# Patient Record
Sex: Female | Born: 1975 | Race: Black or African American | Hispanic: No | Marital: Single | Smoking: Never smoker
Health system: Southern US, Community
[De-identification: ages and names within clinical notes are randomized; demographics above are authoritative.]

## PROBLEM LIST (undated history)

## (undated) DIAGNOSIS — E079 Disorder of thyroid, unspecified: Secondary | ICD-10-CM

## (undated) DIAGNOSIS — D649 Anemia, unspecified: Secondary | ICD-10-CM

## (undated) DIAGNOSIS — R87629 Unspecified abnormal cytological findings in specimens from vagina: Secondary | ICD-10-CM

## (undated) DIAGNOSIS — D219 Benign neoplasm of connective and other soft tissue, unspecified: Secondary | ICD-10-CM

## (undated) DIAGNOSIS — Z9889 Other specified postprocedural states: Secondary | ICD-10-CM

## (undated) HISTORY — DX: Unspecified abnormal cytological findings in specimens from vagina: R87.629

## (undated) HISTORY — DX: Other specified postprocedural states: Z98.890

## (undated) HISTORY — DX: Anemia, unspecified: D64.9

## (undated) HISTORY — DX: Benign neoplasm of connective and other soft tissue, unspecified: D21.9

---

## 1998-06-27 ENCOUNTER — Other Ambulatory Visit: Admission: RE | Admit: 1998-06-27 | Discharge: 1998-06-27 | Payer: Self-pay | Admitting: Obstetrics

## 1998-06-29 ENCOUNTER — Other Ambulatory Visit: Admission: RE | Admit: 1998-06-29 | Discharge: 1998-06-29 | Payer: Self-pay | Admitting: Obstetrics

## 2003-10-16 ENCOUNTER — Other Ambulatory Visit: Admission: RE | Admit: 2003-10-16 | Discharge: 2003-10-16 | Payer: Self-pay | Admitting: Obstetrics and Gynecology

## 2003-11-05 ENCOUNTER — Other Ambulatory Visit: Admission: RE | Admit: 2003-11-05 | Discharge: 2003-11-05 | Payer: Self-pay | Admitting: Obstetrics and Gynecology

## 2003-11-07 ENCOUNTER — Ambulatory Visit (HOSPITAL_COMMUNITY): Admission: RE | Admit: 2003-11-07 | Discharge: 2003-11-07 | Payer: Self-pay | Admitting: General Surgery

## 2003-12-05 ENCOUNTER — Ambulatory Visit (HOSPITAL_COMMUNITY): Admission: RE | Admit: 2003-12-05 | Discharge: 2003-12-05 | Payer: Self-pay | Admitting: General Surgery

## 2003-12-12 ENCOUNTER — Ambulatory Visit (HOSPITAL_COMMUNITY): Admission: RE | Admit: 2003-12-12 | Discharge: 2003-12-12 | Payer: Self-pay | Admitting: Obstetrics and Gynecology

## 2003-12-19 ENCOUNTER — Inpatient Hospital Stay (HOSPITAL_COMMUNITY): Admission: AD | Admit: 2003-12-19 | Discharge: 2003-12-19 | Payer: Self-pay | Admitting: Obstetrics and Gynecology

## 2003-12-21 HISTORY — PX: NECK SURGERY: SHX720

## 2004-01-10 ENCOUNTER — Ambulatory Visit (HOSPITAL_COMMUNITY): Admission: RE | Admit: 2004-01-10 | Discharge: 2004-01-10 | Payer: Self-pay | Admitting: General Surgery

## 2004-01-21 DIAGNOSIS — Z9889 Other specified postprocedural states: Secondary | ICD-10-CM

## 2004-01-21 HISTORY — DX: Other specified postprocedural states: Z98.890

## 2004-02-14 ENCOUNTER — Inpatient Hospital Stay (HOSPITAL_COMMUNITY): Admission: RE | Admit: 2004-02-14 | Discharge: 2004-02-15 | Payer: Self-pay | Admitting: General Surgery

## 2004-12-20 HISTORY — PX: LEEP: SHX91

## 2005-03-11 ENCOUNTER — Other Ambulatory Visit: Admission: RE | Admit: 2005-03-11 | Discharge: 2005-03-11 | Payer: Self-pay | Admitting: Obstetrics and Gynecology

## 2005-10-27 IMAGING — US US SOFT TISSUE HEAD/NECK
1 series · 14 of 25 positions shown · non-contrast
Comparison: none

CLINICAL DATA: The patient has a thyroid nodule.

[Series 1: unknown · 0.08mm/px · 14 of 55 slices shown]
[im 1/55]
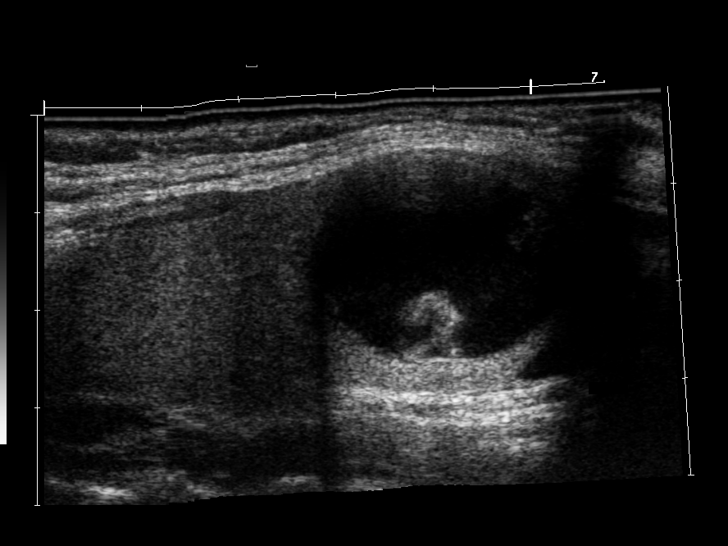
[im 5/55]
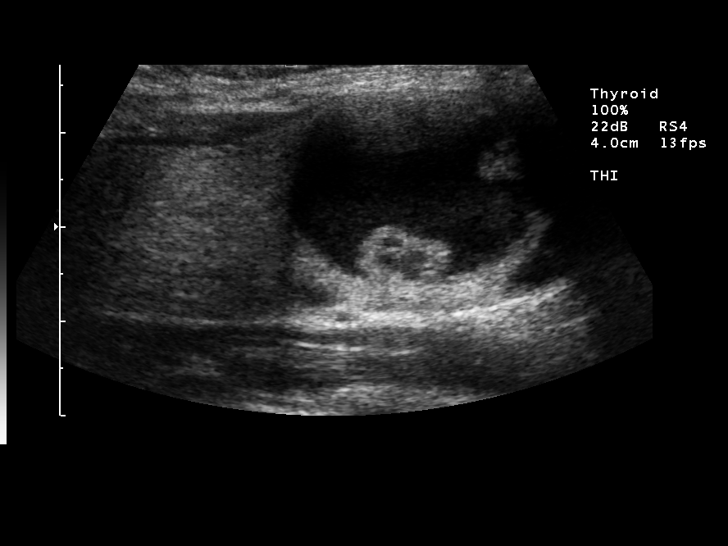
[im 10/55]
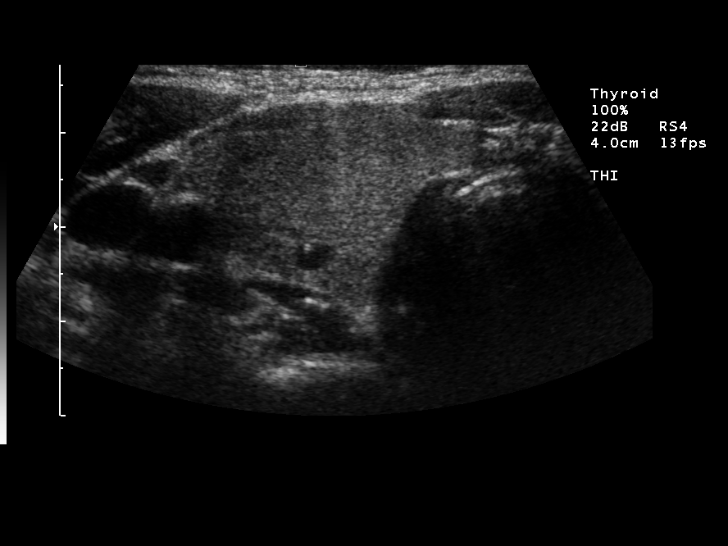
[im 14/55]
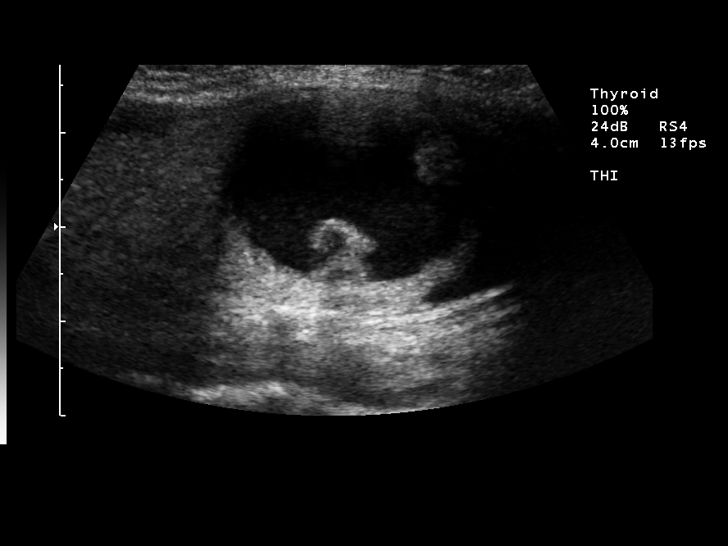
[im 19/55]
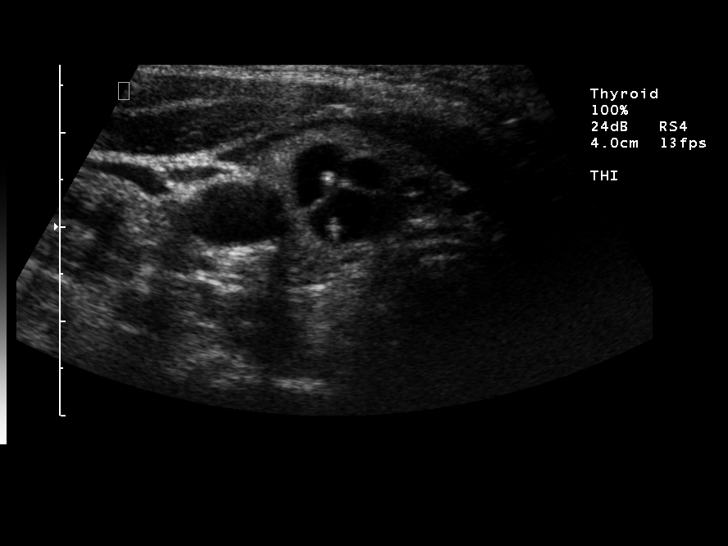
[im 21/55]
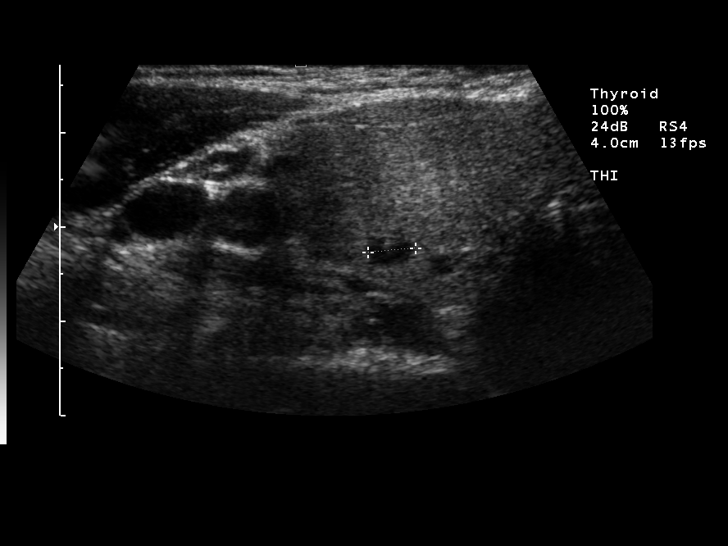
[im 25/55]
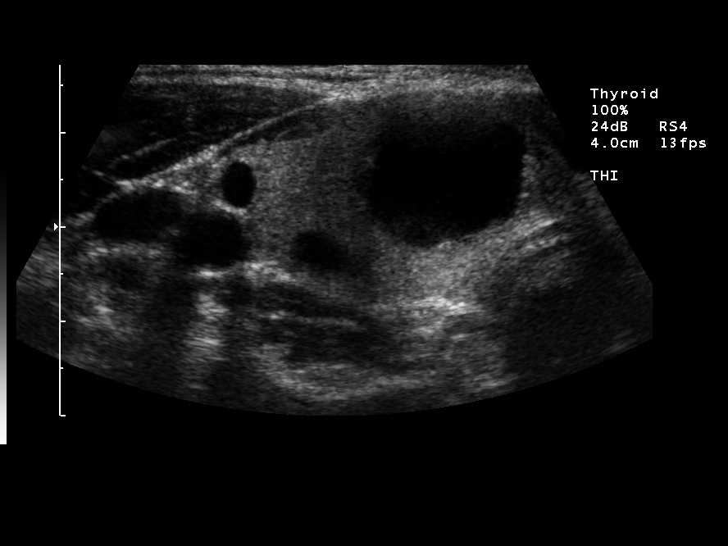
[im 30/55]
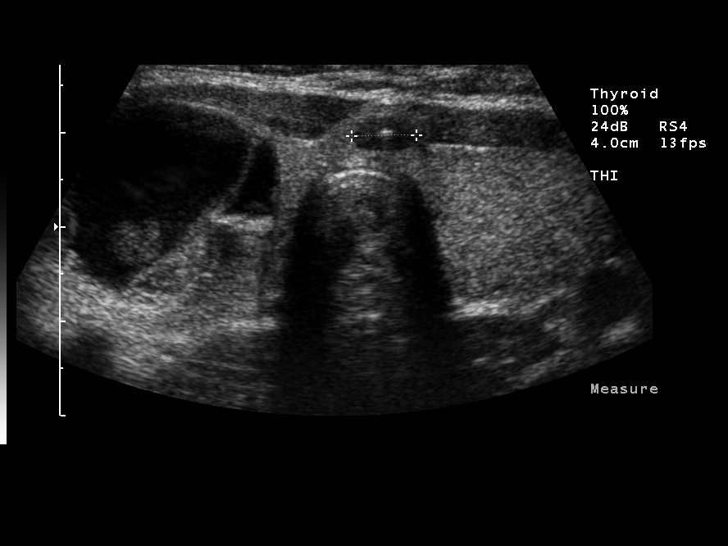
[im 34/55]
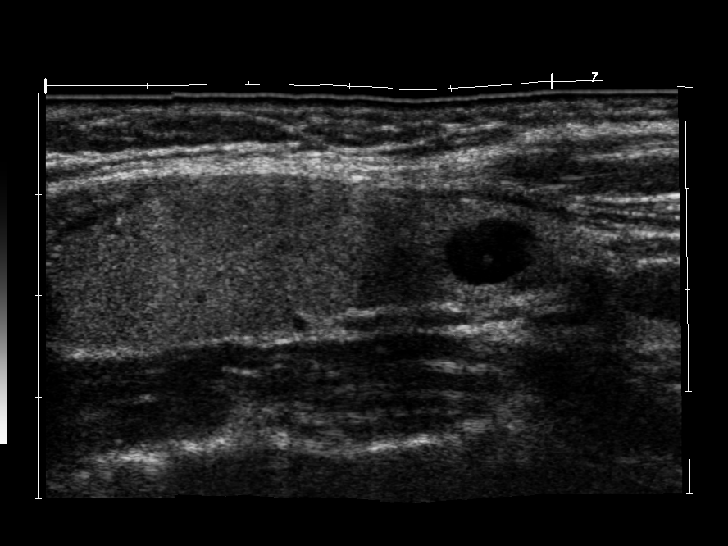
[im 37/55]
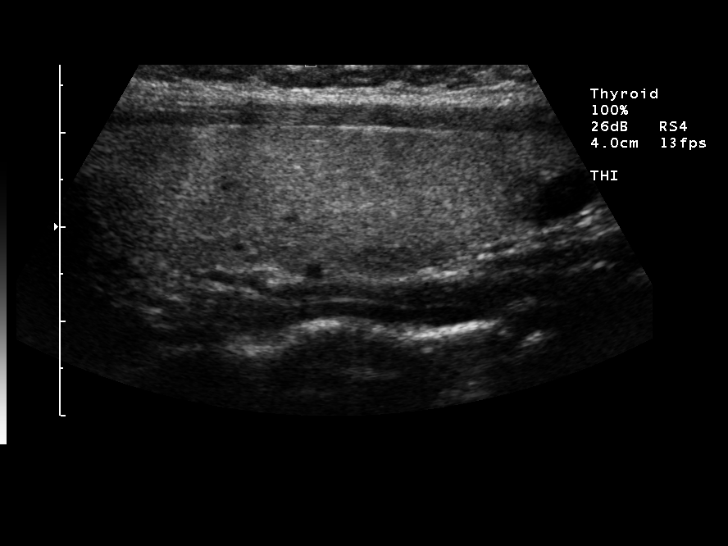
[im 41/55]
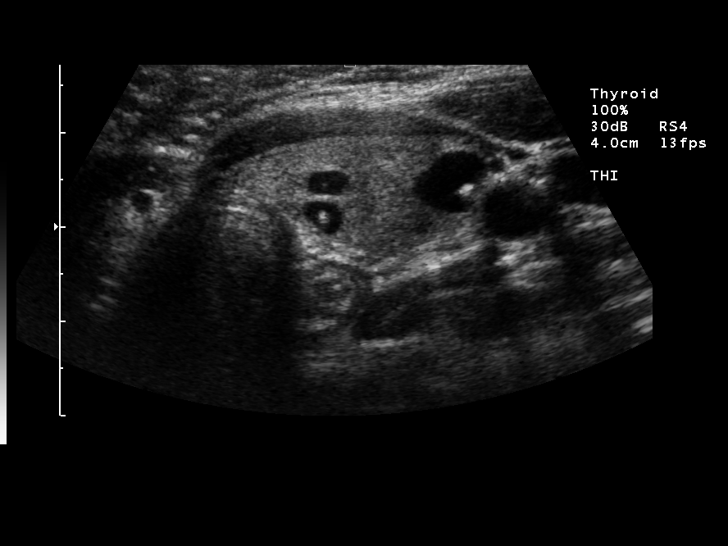
[im 46/55]
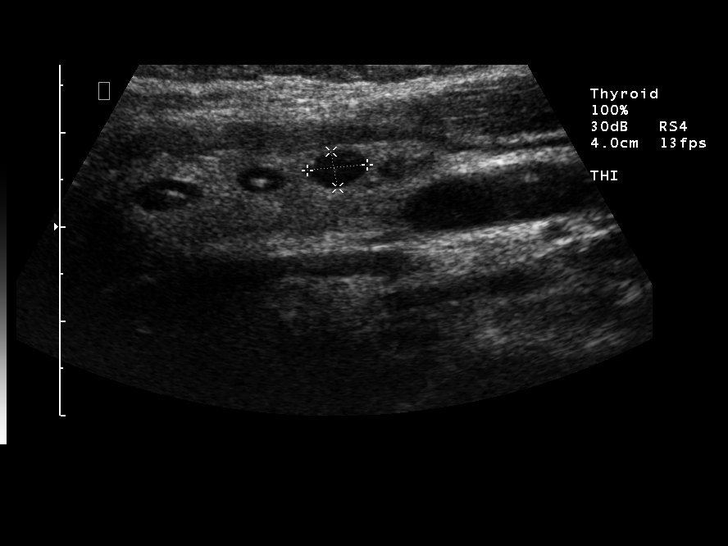
[im 50/55]
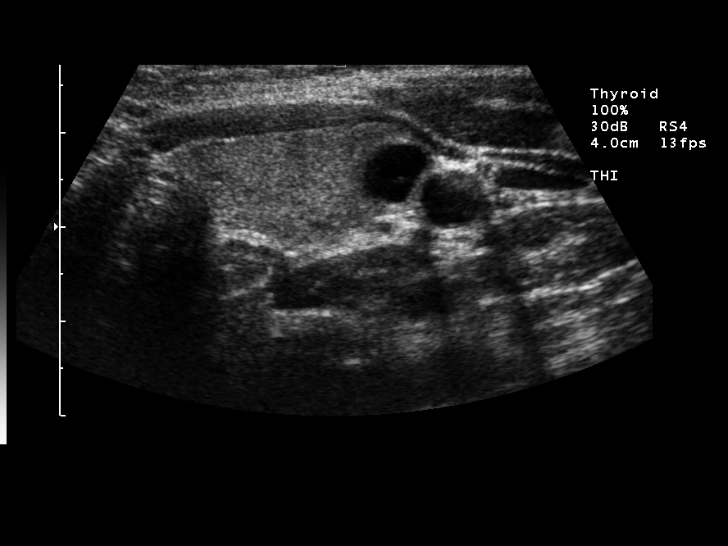
[im 55/55]
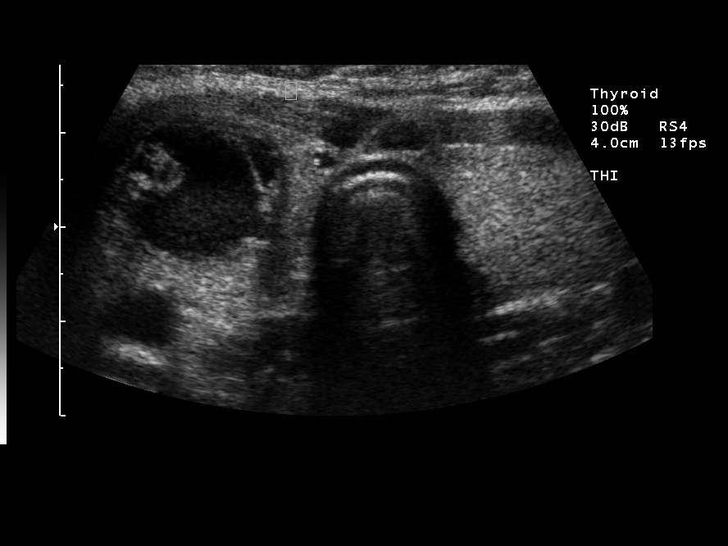

[14 of 25 positions shown; findings below may reference images not displayed]

ULTRASOUND OF THE THYROID
 The right lobe measures 7.3 x 2.3 x 2.5 cm.  The left lobe measures 5.6 x 1.6 x 2.7 cm.  There are noted to be multiple degenerated nodules bilaterally.  The largest is in the lower pole on the right measuring 2.7 x 1.9 x 2.5 cm.  There is a solid component in this degenerated mass of the right lobe.  There are also internal echoes.

 IMPRESSION
 Multinodular goiter with a 2.7 cm degenerated nodule in the right lobe that has internal echoes with an apparent associated solid wall mass.  Biopsy of this mass would be suggested for further evaluation.

## 2006-08-17 ENCOUNTER — Other Ambulatory Visit: Admission: RE | Admit: 2006-08-17 | Discharge: 2006-08-17 | Payer: Self-pay | Admitting: Obstetrics and Gynecology

## 2011-01-09 ENCOUNTER — Encounter (HOSPITAL_BASED_OUTPATIENT_CLINIC_OR_DEPARTMENT_OTHER): Payer: Self-pay | Admitting: General Surgery

## 2011-06-16 ENCOUNTER — Telehealth: Payer: Self-pay | Admitting: Cardiology

## 2011-07-22 NOTE — Telephone Encounter (Signed)
Close  

## 2015-12-31 ENCOUNTER — Emergency Department (HOSPITAL_COMMUNITY): Payer: 59

## 2015-12-31 ENCOUNTER — Encounter (HOSPITAL_COMMUNITY): Payer: Self-pay | Admitting: Emergency Medicine

## 2015-12-31 ENCOUNTER — Inpatient Hospital Stay (HOSPITAL_COMMUNITY)
Admission: EM | Admit: 2015-12-31 | Discharge: 2016-01-05 | DRG: 872 | Disposition: A | Discharge status: Home / Self Care | Payer: 59 | Attending: Internal Medicine | Admitting: Internal Medicine

## 2015-12-31 DIAGNOSIS — B9689 Other specified bacterial agents as the cause of diseases classified elsewhere: Secondary | ICD-10-CM | POA: Diagnosis present

## 2015-12-31 DIAGNOSIS — D649 Anemia, unspecified: Secondary | ICD-10-CM | POA: Diagnosis not present

## 2015-12-31 DIAGNOSIS — D696 Thrombocytopenia, unspecified: Secondary | ICD-10-CM | POA: Diagnosis present

## 2015-12-31 DIAGNOSIS — R1012 Left upper quadrant pain: Secondary | ICD-10-CM | POA: Diagnosis not present

## 2015-12-31 DIAGNOSIS — Z886 Allergy status to analgesic agent status: Secondary | ICD-10-CM | POA: Diagnosis not present

## 2015-12-31 DIAGNOSIS — E039 Hypothyroidism, unspecified: Secondary | ICD-10-CM | POA: Diagnosis present

## 2015-12-31 DIAGNOSIS — R509 Fever, unspecified: Secondary | ICD-10-CM | POA: Diagnosis present

## 2015-12-31 DIAGNOSIS — F40240 Claustrophobia: Secondary | ICD-10-CM | POA: Diagnosis present

## 2015-12-31 DIAGNOSIS — R195 Other fecal abnormalities: Secondary | ICD-10-CM | POA: Diagnosis present

## 2015-12-31 DIAGNOSIS — Z791 Long term (current) use of non-steroidal anti-inflammatories (NSAID): Secondary | ICD-10-CM

## 2015-12-31 DIAGNOSIS — R7881 Bacteremia: Secondary | ICD-10-CM | POA: Diagnosis not present

## 2015-12-31 DIAGNOSIS — R109 Unspecified abdominal pain: Secondary | ICD-10-CM | POA: Diagnosis present

## 2015-12-31 DIAGNOSIS — B9620 Unspecified Escherichia coli [E. coli] as the cause of diseases classified elsewhere: Secondary | ICD-10-CM | POA: Diagnosis present

## 2015-12-31 DIAGNOSIS — N390 Urinary tract infection, site not specified: Secondary | ICD-10-CM | POA: Diagnosis present

## 2015-12-31 DIAGNOSIS — D62 Acute posthemorrhagic anemia: Secondary | ICD-10-CM | POA: Diagnosis present

## 2015-12-31 DIAGNOSIS — Z803 Family history of malignant neoplasm of breast: Secondary | ICD-10-CM

## 2015-12-31 DIAGNOSIS — N920 Excessive and frequent menstruation with regular cycle: Secondary | ICD-10-CM

## 2015-12-31 DIAGNOSIS — E876 Hypokalemia: Secondary | ICD-10-CM | POA: Diagnosis present

## 2015-12-31 DIAGNOSIS — A400 Sepsis due to streptococcus, group A: Secondary | ICD-10-CM | POA: Diagnosis not present

## 2015-12-31 DIAGNOSIS — D259 Leiomyoma of uterus, unspecified: Secondary | ICD-10-CM | POA: Diagnosis present

## 2015-12-31 DIAGNOSIS — R350 Frequency of micturition: Secondary | ICD-10-CM | POA: Diagnosis present

## 2015-12-31 DIAGNOSIS — Z8249 Family history of ischemic heart disease and other diseases of the circulatory system: Secondary | ICD-10-CM | POA: Diagnosis not present

## 2015-12-31 DIAGNOSIS — A419 Sepsis, unspecified organism: Secondary | ICD-10-CM

## 2015-12-31 DIAGNOSIS — I959 Hypotension, unspecified: Secondary | ICD-10-CM | POA: Diagnosis present

## 2015-12-31 DIAGNOSIS — N83209 Unspecified ovarian cyst, unspecified side: Secondary | ICD-10-CM

## 2015-12-31 DIAGNOSIS — K219 Gastro-esophageal reflux disease without esophagitis: Secondary | ICD-10-CM | POA: Diagnosis present

## 2015-12-31 DIAGNOSIS — D219 Benign neoplasm of connective and other soft tissue, unspecified: Secondary | ICD-10-CM | POA: Diagnosis present

## 2015-12-31 DIAGNOSIS — J40 Bronchitis, not specified as acute or chronic: Secondary | ICD-10-CM | POA: Diagnosis present

## 2015-12-31 DIAGNOSIS — R52 Pain, unspecified: Secondary | ICD-10-CM

## 2015-12-31 DIAGNOSIS — R112 Nausea with vomiting, unspecified: Secondary | ICD-10-CM | POA: Diagnosis not present

## 2015-12-31 DIAGNOSIS — N839 Noninflammatory disorder of ovary, fallopian tube and broad ligament, unspecified: Secondary | ICD-10-CM | POA: Diagnosis not present

## 2015-12-31 DIAGNOSIS — A4151 Sepsis due to Escherichia coli [E. coli]: Secondary | ICD-10-CM | POA: Diagnosis not present

## 2015-12-31 DIAGNOSIS — A408 Other streptococcal sepsis: Secondary | ICD-10-CM | POA: Diagnosis not present

## 2015-12-31 HISTORY — DX: Disorder of thyroid, unspecified: E07.9

## 2015-12-31 LAB — CBC WITH DIFFERENTIAL/PLATELET
Basophils Absolute: 0 10*3/uL (ref 0.0–0.1)
Basophils Absolute: 0 10*3/uL (ref 0.0–0.1)
Basophils Relative: 0 %
Basophils Relative: 0 %
Eosinophils Absolute: 0 10*3/uL (ref 0.0–0.7)
Eosinophils Absolute: 0 10*3/uL (ref 0.0–0.7)
Eosinophils Relative: 0 %
Eosinophils Relative: 0 %
HCT: 13.9 % — ABNORMAL LOW (ref 36.0–46.0)
HCT: 13.8 % — ABNORMAL LOW (ref 36.0–46.0)
Hemoglobin: 3.6 g/dL — CL (ref 12.0–15.0)
Hemoglobin: 3.7 g/dL — CL (ref 12.0–15.0)
Lymphocytes Relative: 2 %
Lymphocytes Relative: 2 %
Lymphs Abs: 0.3 10*3/uL — ABNORMAL LOW (ref 0.7–4.0)
Lymphs Abs: 0.3 10*3/uL — ABNORMAL LOW (ref 0.7–4.0)
MCH: 16.1 pg — ABNORMAL LOW (ref 26.0–34.0)
MCH: 16.5 pg — ABNORMAL LOW (ref 26.0–34.0)
MCHC: 25.9 g/dL — ABNORMAL LOW (ref 30.0–36.0)
MCHC: 26.8 g/dL — ABNORMAL LOW (ref 30.0–36.0)
MCV: 62.3 fL — ABNORMAL LOW (ref 78.0–100.0)
MCV: 61.6 fL — ABNORMAL LOW (ref 78.0–100.0)
Monocytes Absolute: 0.3 10*3/uL (ref 0.1–1.0)
Monocytes Absolute: 0.7 10*3/uL (ref 0.1–1.0)
Monocytes Relative: 2 %
Monocytes Relative: 5 %
Neutro Abs: 12.9 10*3/uL — ABNORMAL HIGH (ref 1.7–7.7)
Neutro Abs: 12.5 10*3/uL — ABNORMAL HIGH (ref 1.7–7.7)
Neutrophils Relative %: 96 %
Neutrophils Relative %: 93 %
Platelets: 118 10*3/uL — ABNORMAL LOW (ref 150–400)
Platelets: 117 10*3/uL — ABNORMAL LOW (ref 150–400)
RBC: 2.23 MIL/uL — ABNORMAL LOW (ref 3.87–5.11)
RBC: 2.24 MIL/uL — ABNORMAL LOW (ref 3.87–5.11)
RDW: 20.1 % — ABNORMAL HIGH (ref 11.5–15.5)
RDW: 20.1 % — ABNORMAL HIGH (ref 11.5–15.5)
WBC: 13.5 10*3/uL — ABNORMAL HIGH (ref 4.0–10.5)
WBC: 13.5 10*3/uL — ABNORMAL HIGH (ref 4.0–10.5)

## 2015-12-31 LAB — COMPREHENSIVE METABOLIC PANEL
ALT: 13 U/L — ABNORMAL LOW (ref 14–54)
AST: 40 U/L (ref 15–41)
Albumin: 3.6 g/dL (ref 3.5–5.0)
Alkaline Phosphatase: 43 U/L (ref 38–126)
Anion gap: 15 (ref 5–15)
BUN: 13 mg/dL (ref 6–20)
CO2: 18 mmol/L — ABNORMAL LOW (ref 22–32)
Calcium: 9.4 mg/dL (ref 8.9–10.3)
Chloride: 102 mmol/L (ref 101–111)
Creatinine, Ser: 1.11 mg/dL — ABNORMAL HIGH (ref 0.44–1.00)
GFR calc Af Amer: >60 mL/min (ref >60)
GFR calc non Af Amer: >60 mL/min (ref >60)
Glucose, Bld: 143 mg/dL — ABNORMAL HIGH (ref 65–99)
Potassium: 2.8 mmol/L — ABNORMAL LOW (ref 3.5–5.1)
Sodium: 135 mmol/L (ref 135–145)
Total Bilirubin: 0.6 mg/dL (ref 0.3–1.2)
Total Protein: 7.8 g/dL (ref 6.5–8.1)

## 2015-12-31 LAB — RETICULOCYTES
RBC.: 2.24 MIL/uL — ABNORMAL LOW (ref 3.87–5.11)
Retic Count, Absolute: 33.6 10*3/uL (ref 19.0–186.0)
Retic Ct Pct: 1.5 % (ref 0.4–3.1)

## 2015-12-31 LAB — TSH: TSH: 2.863 u[IU]/mL (ref 0.350–4.500)

## 2015-12-31 LAB — URINALYSIS, ROUTINE W REFLEX MICROSCOPIC
Bilirubin Urine: NEGATIVE
Glucose, UA: NEGATIVE mg/dL
Ketones, ur: NEGATIVE mg/dL
Nitrite: POSITIVE — AB
Protein, ur: 100 mg/dL — AB
Specific Gravity, Urine: 1.019 (ref 1.005–1.030)
pH: 6 (ref 5.0–8.0)

## 2015-12-31 LAB — CBG MONITORING, ED: Glucose-Capillary: 134 mg/dL — ABNORMAL HIGH (ref 65–99)

## 2015-12-31 LAB — LACTIC ACID, PLASMA
Lactic Acid, Venous: 4.3 mmol/L (ref 0.5–2.0)
Lactic Acid, Venous: 1.6 mmol/L (ref 0.5–2.0)

## 2015-12-31 LAB — URINE MICROSCOPIC-ADD ON

## 2015-12-31 LAB — DIC (DISSEMINATED INTRAVASCULAR COAGULATION)PANEL
D-Dimer, Quant: 1.58 ug/mL-FEU — ABNORMAL HIGH (ref 0.00–0.50)
INR: 1.52 — ABNORMAL HIGH (ref 0.00–1.49)
Platelets: 103 10*3/uL — ABNORMAL LOW (ref 150–400)
Smear Review: NONE SEEN
aPTT: 37 seconds (ref 24–37)

## 2015-12-31 LAB — DIC (DISSEMINATED INTRAVASCULAR COAGULATION) PANEL
FIBRINOGEN: 538 mg/dL — AB (ref 204–475)
PROTHROMBIN TIME: 18.4 s — AB (ref 11.6–15.2)

## 2015-12-31 LAB — POC OCCULT BLOOD, ED: FECAL OCCULT BLD: POSITIVE — AB

## 2015-12-31 LAB — PREPARE RBC (CROSSMATCH)

## 2015-12-31 LAB — PREGNANCY, URINE: PREG TEST UR: NEGATIVE

## 2015-12-31 LAB — LACTATE DEHYDROGENASE: LDH: 145 U/L (ref 98–192)

## 2015-12-31 LAB — LIPASE, BLOOD: Lipase: 26 U/L (ref 11–51)

## 2015-12-31 LAB — ABO/RH: ABO/RH(D): A POS

## 2015-12-31 MED ORDER — POTASSIUM CHLORIDE 10 MEQ/100ML IV SOLN: 10.0000 meq | INTRAVENOUS | Status: AC

## 2015-12-31 MED ORDER — ACETAMINOPHEN 325 MG PO TABS: 650.0000 mg | ORAL_TABLET | Freq: Four times a day (QID) | ORAL | Status: DC | PRN

## 2015-12-31 MED ORDER — MORPHINE SULFATE (PF) 2 MG/ML IV SOLN
2.0000 mg | INTRAVENOUS | Status: DC | PRN
  Administered 2015-12-31 – 2016-01-02 (×4): 2 mg via INTRAVENOUS
  Filled 2015-12-31 (×4): qty 1

## 2015-12-31 MED ORDER — VANCOMYCIN HCL IN DEXTROSE 1-5 GM/200ML-% IV SOLN
1000.0000 mg | Freq: Two times a day (BID) | INTRAVENOUS | Status: DC
  Administered 2016-01-01 – 2016-01-03 (×5): 1000 mg via INTRAVENOUS
  Filled 2015-12-31 (×5): qty 200

## 2015-12-31 MED ORDER — SODIUM CHLORIDE 0.9 % IV SOLN
Freq: Once | INTRAVENOUS | Status: AC
  Administered 2015-12-31: 18:00:00 via INTRAVENOUS

## 2015-12-31 MED ORDER — ADULT MULTIVITAMIN W/MINERALS CH
1.0000 | ORAL_TABLET | Freq: Every day | ORAL | Status: DC
  Administered 2015-12-31 – 2016-01-05 (×6): 1 via ORAL
  Filled 2015-12-31 (×7): qty 1

## 2015-12-31 MED ORDER — IOHEXOL 300 MG/ML  SOLN
100.0000 mL | Freq: Once | INTRAMUSCULAR | Status: AC | PRN
  Administered 2015-12-31: 100 mL via INTRAVENOUS

## 2015-12-31 MED ORDER — PIPERACILLIN-TAZOBACTAM 3.375 G IVPB 30 MIN
3.3750 g | Freq: Once | INTRAVENOUS | Status: AC
  Administered 2015-12-31: 3.375 g via INTRAVENOUS
  Filled 2015-12-31: qty 50

## 2015-12-31 MED ORDER — POTASSIUM CHLORIDE 10 MEQ/100ML IV SOLN
10.0000 meq | Freq: Once | INTRAVENOUS | Status: AC
  Administered 2015-12-31: 10 meq via INTRAVENOUS
  Filled 2015-12-31: qty 100

## 2015-12-31 MED ORDER — ACETAMINOPHEN 325 MG PO TABS
650.0000 mg | ORAL_TABLET | Freq: Once | ORAL | Status: AC
  Administered 2015-12-31: 650 mg via ORAL
  Filled 2015-12-31: qty 2

## 2015-12-31 MED ORDER — POTASSIUM CHLORIDE CRYS ER 20 MEQ PO TBCR
40.0000 meq | EXTENDED_RELEASE_TABLET | Freq: Once | ORAL | Status: AC
  Administered 2015-12-31: 40 meq via ORAL
  Filled 2015-12-31: qty 2

## 2015-12-31 MED ORDER — SODIUM CHLORIDE 0.9 % IV BOLUS (SEPSIS)
1000.0000 mL | Freq: Once | INTRAVENOUS | Status: AC
  Administered 2015-12-31: 1000 mL via INTRAVENOUS

## 2015-12-31 MED ORDER — VANCOMYCIN HCL 10 G IV SOLR
1500.0000 mg | Freq: Once | INTRAVENOUS | Status: AC
  Administered 2015-12-31: 1500 mg via INTRAVENOUS
  Filled 2015-12-31: qty 1500

## 2015-12-31 MED ORDER — VANCOMYCIN HCL IN DEXTROSE 1-5 GM/200ML-% IV SOLN: 1000.0000 mg | Freq: Two times a day (BID) | INTRAVENOUS | Status: DC

## 2015-12-31 MED ORDER — FENTANYL CITRATE (PF) 100 MCG/2ML IJ SOLN
100.0000 ug | Freq: Once | INTRAMUSCULAR | Status: AC
  Administered 2015-12-31: 100 ug via INTRAVENOUS
  Filled 2015-12-31: qty 2

## 2015-12-31 MED ORDER — SODIUM CHLORIDE 0.9 % IV BOLUS (SEPSIS)
2000.0000 mL | Freq: Once | INTRAVENOUS | Status: AC
  Administered 2015-12-31: 2000 mL via INTRAVENOUS

## 2015-12-31 MED ORDER — ONDANSETRON HCL 4 MG/2ML IJ SOLN
4.0000 mg | Freq: Three times a day (TID) | INTRAMUSCULAR | Status: DC | PRN
  Administered 2015-12-31: 4 mg via INTRAVENOUS
  Filled 2015-12-31: qty 2

## 2015-12-31 MED ORDER — SODIUM CHLORIDE 0.9 % IJ SOLN
3.0000 mL | Freq: Two times a day (BID) | INTRAMUSCULAR | Status: DC
  Administered 2016-01-01 – 2016-01-05 (×7): 3 mL via INTRAVENOUS

## 2015-12-31 MED ORDER — ONDANSETRON HCL 4 MG/2ML IJ SOLN
4.0000 mg | Freq: Once | INTRAMUSCULAR | Status: AC
  Administered 2015-12-31: 4 mg via INTRAVENOUS
  Filled 2015-12-31: qty 2

## 2015-12-31 MED ORDER — SODIUM CHLORIDE 0.9 % IV SOLN
INTRAVENOUS | Status: DC
  Administered 2015-12-31 – 2016-01-04 (×5): via INTRAVENOUS

## 2015-12-31 MED ORDER — ACETAMINOPHEN 650 MG RE SUPP: 650.0000 mg | Freq: Four times a day (QID) | RECTAL | Status: DC | PRN

## 2015-12-31 MED ORDER — MEGESTROL ACETATE 40 MG PO TABS
40.0000 mg | ORAL_TABLET | Freq: Every day | ORAL | Status: DC
  Administered 2015-12-31 – 2016-01-05 (×6): 40 mg via ORAL
  Filled 2015-12-31 (×8): qty 1

## 2015-12-31 MED ORDER — PIPERACILLIN-TAZOBACTAM 3.375 G IVPB
3.3750 g | Freq: Three times a day (TID) | INTRAVENOUS | Status: DC
  Administered 2016-01-01 – 2016-01-05 (×14): 3.375 g via INTRAVENOUS
  Filled 2015-12-31 (×16): qty 50

## 2015-12-31 MED ORDER — PANTOPRAZOLE SODIUM 40 MG IV SOLR
40.0000 mg | Freq: Two times a day (BID) | INTRAVENOUS | Status: DC
  Administered 2015-12-31 – 2016-01-05 (×10): 40 mg via INTRAVENOUS
  Filled 2015-12-31 (×10): qty 40

## 2015-12-31 NOTE — H&P (Signed)
[facility] History and Physical  Alicia Carson [id number] DOB: 10-04-1976 DOA: 12/31/2015  Referring physician: ED physician PCP: No primary care provider on file.  Specialists:   Chief Complaint: Nausea, vomiting, abdominal pain, heavy menstrual period and increased urinary frequency, fever  HPI: Alicia Carson is a 40 y.o. female with PMH of thyroid disease, heavy menstrual period, who presents with nausea, vomiting, abdominal pain, heavy menstrual period and increased urinary frequency and fever.  Patient reports that she has been having fever, chills, nausea, vomiting, abdominal pain in the past 3 days. She vomited twice today without blood in the vomitus. Her abdominal pain is located in the left abdomen,, intermittent, 5 out of 10 in severity, nonradiating. She does not have diarrhea.   She also has increased urinary frequency, but no dysuria or burning on urination. She reports that she has irregular, but heavy menstrual period for a long time. Last menstrual period started on Sunday, has been still going on. She denies dark stool or rectal bleeding. She is taking ibuprofen sometimes. She has shortness of breath, but no chest pain, cough. No unilateral weakness.  In ED, patient was found to have hemoglobin 3.6 (no previous hemoglobin on record),platelets 118, WBC 13.5, LDH 145, liver functions normal, tachycardia, temperature 102.9, lactate 4.3--> 1.6, lipase 26, TSH 2.863, negative pregnancy test, positive urinalysis, potassium 2.8, creatinine 1.11. FOBT is positive (not sure whether it is contaminated from vaginal bleeding per EDP). Patient's and admitted to inpatient for further events and treatment.  US-pelvis and transvaginal ultrasound showed probable central fibroid, central cervical mass could represent a fibroid, complex nabothian cyst, or malignancy, cystic lesion also noted in the posterior aspect of the cervix, left ovarian mass is suspicious for neoplasm.  Ct-abd/pelvis showed possible intramural anterior uterine fibroid with substantial mass effect on the endometrial stripe, prominent bilateral gonadal vasculature with perivascular edema/inflammation. Gonadal vein thrombosis could cause this appearance, but lack of intravenous contrast on today's study hinders assessment, 5.8 cm left ovarian cyst without evidence for internal septation or with asymmetric wall thickening, cholelithiasis without biliary dilatation.  EKG: Independently reviewed. QTC 425, tachycardia.  Where does patient live?   At home  Can patient participate in ADLs?  Yes  Review of Systems:   General: has fevers, chills, no changes in body weight, has poor appetite, has fatigue HEENT: no blurry vision, hearing changes or sore throat Pulm: has dyspnea, no coughing, wheezing CV: no chest pain, palpitations Abd: has nausea, vomiting, abdominal pain, no diarrhea, constipation GU: no dysuria and burning on urination, but has increased urinary frequency, hematuria  Ext: no leg edema Neuro: no unilateral weakness, numbness, or tingling, no vision change or hearing loss Skin: no rash MSK: No muscle spasm, no deformity, no limitation of range of movement in spin Heme: No easy bruising.  Travel history: No recent long distant travel.  Allergy:  Allergies  Allergen Reactions  . Advil Pm [Ibuprofen-Diphenhydramine Cit] Hives    Past Medical History  Diagnosis Date  . Thyroid disease     hypo    Past Surgical History  Procedure Laterality Date  . Neck surgery Left 2005    Social History:  reports that she has never smoked. She does not have any smokeless tobacco history on file. She reports that she does not drink alcohol. Her drug history is not on file.  Family History:  Family History  Problem Relation Age of Onset  . Anemia Mother   . Hypertension Father   . Hypertension  Sister      Prior to Admission medications   Medication Sig Start Date End Date Taking?  Authorizing Provider  ibuprofen (ADVIL) 200 MG tablet Take 400 mg by mouth every 6 (six) hours as needed for moderate pain.   Yes Historical Provider, MD  Multiple Vitamins-Minerals (WOMENS ONE DAILY PO) Take 1 tablet by mouth daily.   Yes Historical Provider, MD    Physical Exam: Filed Vitals:   12/31/15 1823 12/31/15 1900 12/31/15 1920 12/31/15 2125  BP:  92/56 92/56 102/60  Pulse:  116 116 103  Temp:    101 F (38.3 C)  TempSrc:    Oral  Resp:   18 18  Height: 5\' 3"  (1.6 m)     Weight: 79.379 kg (175 lb)     SpO2:  100% 100% 100%   General: Not in acute distress HEENT:       Eyes: PERRL, EOMI, no scleral icterus.       ENT: No discharge from the ears and nose, no pharynx injection, no tonsillar enlargement.        Neck: No JVD, no bruit, no mass felt. Heme: No neck lymph node enlargement. Cardiac: S1/S2, RRR, No murmurs, No gallops or rubs. Pulm: Good air movement bilaterally. No rales, wheezing, rhonchi or rubs. Abd: Soft, nondistended, nontender, no rebound pain, no organomegaly, BS present. Ext: No pitting leg edema bilaterally. 2+DP/PT pulse bilaterally. Musculoskeletal: No joint deformities, No joint redness or warmth, no limitation of ROM in spin. Skin: No rashes.  Neuro: Alert, oriented X3, cranial nerves II-XII grossly intact, muscle strength 5/5 in all extremities, sensation to light touch intact. Brachial reflex 2+ bilaterally. Knee reflex 1+ bilaterally. Negative Babinski's sign. Normal finger to nose test. Psych: Patient is not psychotic, no suicidal or hemocidal ideation.  Labs on Admission:  Basic Metabolic Panel:  Recent Labs Lab 12/31/15 1410  NA 135  K 2.8*  CL 102  CO2 18*  GLUCOSE 143*  BUN 13  CREATININE 1.11*  CALCIUM 9.4   Liver Function Tests:  Recent Labs Lab 12/31/15 1410  AST 40  ALT 13*  ALKPHOS 43  BILITOT 0.6  PROT 7.8  ALBUMIN 3.6    Recent Labs Lab 12/31/15 1410  LIPASE 26   No results for input(s): AMMONIA in the  last 168 hours. CBC:  Recent Labs Lab 12/31/15 1410 12/31/15 1527 12/31/15 2103  WBC 13.5* 13.5*  --   NEUTROABS 12.9* 12.5*  --   HGB 3.6* 3.7*  --   HCT 13.9* 13.8*  --   MCV 62.3* 61.6*  --   PLT 118* 117* PENDING   Cardiac Enzymes: No results for input(s): CKTOTAL, CKMB, CKMBINDEX, TROPONINI in the last 168 hours.  BNP (last 3 results) No results for input(s): BNP in the last 8760 hours.  ProBNP (last 3 results) No results for input(s): PROBNP in the last 8760 hours.  CBG:  Recent Labs Lab 12/31/15 1405  GLUCAP 134*    Radiological Exams on Admission: Dg Chest 2 View  12/31/2015  CLINICAL DATA:  LEFT side chest pain and abdomen, shortness of breath, fever and dry cough since yesterday EXAM: CHEST  2 VIEW COMPARISON:  02/12/2004 FINDINGS: Upper normal heart size. Normal mediastinal contours and pulmonary vascularity. Mild chronic central peribronchial thickening. No pulmonary infiltrate, pleural effusion or pneumothorax. Bones unremarkable. IMPRESSION: Mild chronic bronchitic changes without acute infiltrate. Electronically Signed   By: Lavonia Dana M.D.   On: 12/31/2015 15:32   US Transvaginal Non-ob  12/31/2015  CLINICAL DATA:  Nausea, vomiting. Question of fibroids and left ovarian cyst by CT today. Heavy bleeding. EXAM: TRANSABDOMINAL AND TRANSVAGINAL ULTRASOUND OF PELVIS TECHNIQUE: Both transabdominal and transvaginal ultrasound examinations of the pelvis were performed. Transabdominal technique was performed for global imaging of the pelvis including uterus, ovaries, adnexal regions, and pelvic cul-de-sac. It was necessary to proceed with endovaginal exam following the transabdominal exam to visualize the endometrium. COMPARISON:  CT of the abdomen and pelvis 12/31/2015 FINDINGS: Uterus Measurements: Uterus is at least 14.5 x 9.8 x 9.5 cm. A circumscribed hypoechoic heterogeneous mass is identified in the central uterus, likely displacing the endometrial canal and it  measures 6.7 x 5.5 x 5.3 cm. This is suspected to be a submucosal fibroid. Within the lower uterine segment there is a heterogeneous slightly hyperechoic mass in the central cervix measuring 5.0 x 3.0 x 5.2 cm. Small cystic lesion is identified in the posterior wall of the cervix measuring 2.7 x 1.0 x 2.1 cm. Endometrium Thickness: Obscured by fibroid, described above. Right ovary Measurements: 3.6 x 1.8 x 2.7 cm. Follicles are present; normal appearance. Left ovary Measurements: 8.9 x 6.2 x 5.7 cm. Normal ovarian tissue appears replaced by homogeneous hypoechoic and vascular tissue, suspicious for neoplasm. Other findings Trace free pelvic fluid. IMPRESSION: 1. Complex appearance of the uterus. Probable central fibroid displaces the endometrial canal. Central cervical mass could represent a fibroid, complex nabothian cyst, or malignancy. Cystic lesion also noted in the posterior aspect of the cervix. 2. Left ovarian mass is suspicious for neoplasm. Further evaluation is recommended. 3. Consider MRI for further characterization of these findings prior to surgical evaluation. Electronically Signed   By: Nolon Nations M.D.   On: 12/31/2015 19:19   US Pelvis Complete  12/31/2015  CLINICAL DATA:  Nausea, vomiting. Question of fibroids and left ovarian cyst by CT today. Heavy bleeding. EXAM: TRANSABDOMINAL AND TRANSVAGINAL ULTRASOUND OF PELVIS TECHNIQUE: Both transabdominal and transvaginal ultrasound examinations of the pelvis were performed. Transabdominal technique was performed for global imaging of the pelvis including uterus, ovaries, adnexal regions, and pelvic cul-de-sac. It was necessary to proceed with endovaginal exam following the transabdominal exam to visualize the endometrium. COMPARISON:  CT of the abdomen and pelvis 12/31/2015 FINDINGS: Uterus Measurements: Uterus is at least 14.5 x 9.8 x 9.5 cm. A circumscribed hypoechoic heterogeneous mass is identified in the central uterus, likely displacing  the endometrial canal and it measures 6.7 x 5.5 x 5.3 cm. This is suspected to be a submucosal fibroid. Within the lower uterine segment there is a heterogeneous slightly hyperechoic mass in the central cervix measuring 5.0 x 3.0 x 5.2 cm. Small cystic lesion is identified in the posterior wall of the cervix measuring 2.7 x 1.0 x 2.1 cm. Endometrium Thickness: Obscured by fibroid, described above. Right ovary Measurements: 3.6 x 1.8 x 2.7 cm. Follicles are present; normal appearance. Left ovary Measurements: 8.9 x 6.2 x 5.7 cm. Normal ovarian tissue appears replaced by homogeneous hypoechoic and vascular tissue, suspicious for neoplasm. Other findings Trace free pelvic fluid. IMPRESSION: 1. Complex appearance of the uterus. Probable central fibroid displaces the endometrial canal. Central cervical mass could represent a fibroid, complex nabothian cyst, or malignancy. Cystic lesion also noted in the posterior aspect of the cervix. 2. Left ovarian mass is suspicious for neoplasm. Further evaluation is recommended. 3. Consider MRI for further characterization of these findings prior to surgical evaluation. Electronically Signed   By: Nolon Nations M.D.   On: 12/31/2015 19:19  Ct Abdomen Pelvis W Contrast  12/31/2015  CLINICAL DATA:  Three day history of left upper quadrant and left back pain with nausea and vomiting. EXAM: CT ABDOMEN AND PELVIS WITH CONTRAST TECHNIQUE: Multidetector CT imaging of the abdomen and pelvis was performed using the standard protocol following bolus administration of intravenous contrast. CONTRAST:  175mL OMNIPAQUE IOHEXOL 300 MG/ML  SOLN COMPARISON:  None. FINDINGS: Lower chest:  Unremarkable. Hepatobiliary: No focal abnormality within the liver parenchyma. 8 mm calcified stone identified in the gallbladder. No intrahepatic or extrahepatic biliary dilation. Pancreas: No focal mass lesion. No dilatation of the main duct. No intraparenchymal cyst. No peripancreatic edema. Spleen: No  splenomegaly. No focal mass lesion. Adrenals/Urinary Tract: No adrenal nodule or mass. 6 mm hypodensity in the upper pole right kidney is too small to characterize but likely represents a cyst. Left kidney is unremarkable. No evidence for hydroureter. The urinary bladder appears normal for the degree of distention. Stomach/Bowel: Stomach is nondistended. No gastric wall thickening. No evidence of outlet obstruction. Duodenum is normally positioned as is the ligament of Treitz. No small bowel wall thickening. No small bowel dilatation. The terminal ileum is normal. The appendix is normal. Cecal tip is high in the right abdomen. No gross colonic mass. No colonic wall thickening. No substantial diverticular change. Vascular/Lymphatic: No abdominal aortic aneurysm. No abdominal atherosclerotic calcification. There is no gastrohepatic or hepatoduodenal ligament lymphadenopathy. No intraperitoneal or retroperitoneal lymphadenopy. No pelvic sidewall lymphadenopathy. The gonadal vasculature is prominent bilaterally with perivascular edema/inflammation. Reproductive: Uterus appears markedly enlarged measuring 10.6 x 15.2 x 9.8 cm. Anterior intramural lesion measures 5.9 x 6.6 x 7.3 cm and displaces the endometrial cavity posteriorly. This is probably a degenerated fibroid. Left ovary is prominent with 4.1 x 5.8 cm cystic lesion within the parenchyma. The left gonadal vasculature is engorged. Right ovary is posteriorly positioned in the cul-de-sac and right gonadal vasculature is also quite prominent. Other: Trace free fluid is seen in the cul-de-sac. Musculoskeletal: Bone windows reveal no worrisome lytic or sclerotic osseous lesions. IMPRESSION: 1. Enlarged uterus with complex anterior intramural cystic lesion, presumably representing age degenerated, intramural anterior uterine fibroid with substantial mass effect on the endometrial stripe. Pelvic ultrasound may prove helpful to further evaluate. 2. Prominent bilateral  gonadal vasculature with perivascular edema/inflammation. Gonadal vein thrombosis could cause this appearance, but lack of intravenous contrast on today's study hinders assessment. 3. Trace free fluid in the cul-de-sac. This can be physiologic in a premenopausal female. 4. 5.8 cm left ovarian cyst without evidence for internal septation or with asymmetric wall thickening by CT imaging. Pelvic ultrasound may prove helpful to further evaluate. 5. Cholelithiasis without biliary dilatation. Electronically Signed   By: Misty Stanley M.D.   On: 12/31/2015 17:56    Assessment/Plan Principal Problem:   Symptomatic anemia Active Problems:   Heavy menstrual period   UTI (lower urinary tract infection)   Sepsis (HCC)   Fibroid   Fever   Nausea & vomiting   Abdominal pain   Hypokalemia   Symptomatic anemia: Hemoglobin 3.6, blood pressure was soft initially, which improved to 102/60 with IV fluid. Currently hemodynamically stable. Etiology is not clear.it is likely due heavy menstrual periods secondary to fibroid. Her FOBT is positive, but per EDP, it is not sure whether it is contaminated by vaginal bleeding. Gynecology, Dr. Glo Herring was consulted by EDP, and recommended start megestrol. Hematology, Dr. Burr Medico was consulted, recommended DIC panel.   -will admit to tele bed.  -transfuse 4 units of blood  -  cbc q6h at 1 hour after blood transfusion. -Repeat FOBT -peripheral smear, anemia panel -follow DIC panel -megestrol started by EDP -IVF  Sepsis: likely due to UTI, but cannot rule out other possibilities. Blood pressure soft, which responded to IV fluid. Currently hemodynamically stable. -will get Procalcitonin and trend lactic acid levels per sepsis protocol. -IVF: 3.0L of NS bolus in ED, followed by 75 -IV vanco and zosyn were started by EDP, will continue now, but will need to narrow down as soon as possible based on blood culture and urine culture results. -f/u Bx and Ux  UTI: -as above    Nausea, vomiting, abdominal pain: Lipase is negative. Differential diagnosis includes viral gastritis, ibuprofen-induced gastritis. Patient has abnormal findings by ultrasound of pelvis, which may have also contributed.   which may have also contrituted -empiric IV protonix 40 mg bid -Prn Zofran for nausea, morphine for pain -IV fluids as above   Heavy menstrual period: US showed probable central fibroid, central cervical mass could represent a fibroid, complex nabothian cyst, or malignancy, cystic lesion also noted in the posterior aspect of the cervix, left ovarian mass is suspicious for neoplasm. TSH is normal. -on megestrol -f/u gynecology's recommendation  Hypokalemia: K= 2.8 on admission. - Repleted - Check Mg level   DVT ppx: SCD Code Status: Full code Family Communication: Yes, patient's  son at bed side Disposition Plan: Admit to inpatient   Date of Service 12/31/2015    Ivor Costa [facility] Pager [phone]  If 7PM-7AM, please contact night-coverage [email or web address] Password [facility] 12/31/2015, 10:15 PM

## 2015-12-31 NOTE — ED Notes (Signed)
Pt states since Monday she has been having n/v and  "the shakes". Pt feels as if her sugar has dropped but pt is not a diabetic.

## 2015-12-31 NOTE — ED Notes (Signed)
Unable to collect labs because patient just had blood products.  I called lab to confirmed.

## 2015-12-31 NOTE — Consult Note (Signed)
[place]  [phone]) [phone] Fax:([phone]  Clinic New consult Note   No care team member to display 01/01/2016  REFERRAL PHYSICIAN: [facility] Dr. Dayna Barker  CHIEF COMPLAINTS/PURPOSE OF CONSULTATION:  Severe anemia and mild thrombocytopenia   HISTORY OF PRESENTING ILLNESS:  Alicia Carson 40 y.o. female  with past medical history of uterine fibroid, heavy menstrual period, hypothyroidism,  Presented to [facility] emergency room with worsening fatigue, nausea vomiting, left abdominal pain,  Urinary frequency and fever.  Her symptom has started about few weeks ago, and the fever has been going on for 3 days.  She has menstrual period once a months,  It lasts about 5-6 days, heavy  Everyday, she changes pad every 2-3 hours.  She denies any other bleeding.    she is to have a oncologist, but have not seen Dr. For several years. She had a history of hypothyroidism  from hemi- thyroidectomy, supposed to take Synthroid which she has not done 4 years. She denies any other significant medical history    MEDICAL HISTORY:  Past Medical History  Diagnosis Date  . Thyroid disease     hypo    SURGICAL HISTORY: Past Surgical History  Procedure Laterality Date  . Neck surgery Left 2005    SOCIAL HISTORY: Social History   Social History  . Marital Status: Single    Spouse Name: N/A  . Number of Children: N/A  . Years of Education: N/A   Occupational History  . Not on file.   Social History Main Topics  . Smoking status: Never Smoker   . Smokeless tobacco: Not on file  . Alcohol Use: No  . Drug Use: Not on file  . Sexual Activity: Not on file   Other Topics Concern  . Not on file   Social History Narrative  . No narrative on file    FAMILY HISTORY: Family History  Problem Relation Age of Onset  . Anemia Mother   . Hypertension Father   . Hypertension Sister     ALLERGIES:  is allergic to advil pm.  MEDICATIONS:  Current  Facility-Administered Medications  Medication Dose Route Frequency Provider Last Rate Last Dose  . 0.9 %  sodium chloride infusion   Intravenous Continuous Ivor Costa, MD 75 mL/hr at 12/31/15 2258    . acetaminophen (TYLENOL) tablet 650 mg  650 mg Oral Q6H PRN Ivor Costa, MD       Or  . acetaminophen (TYLENOL) suppository 650 mg  650 mg Rectal Q6H PRN Ivor Costa, MD      . megestrol (MEGACE) tablet 40 mg  40 mg Oral Daily Merrily Pew, MD   40 mg at 12/31/15 2311  . morphine 2 MG/ML injection 2 mg  2 mg Intravenous Q4H PRN Ivor Costa, MD   2 mg at 12/31/15 2139  . multivitamin with minerals tablet 1 tablet  1 tablet Oral Daily Ivor Costa, MD   1 tablet at 12/31/15 2311  . ondansetron (ZOFRAN) injection 4 mg  4 mg Intravenous Q8H PRN Ivor Costa, MD   4 mg at 12/31/15 2139  . pantoprazole (PROTONIX) injection 40 mg  40 mg Intravenous Q12H Ivor Costa, MD   40 mg at 12/31/15 2139  . piperacillin-tazobactam (ZOSYN) IVPB 3.375 g  3.375 g Intravenous Q8H Minda Ditto, RPH   3.375 g at 01/01/16 0606  . sodium chloride 0.9 % injection 3 mL  3 mL Intravenous Q12H Ivor Costa, MD   3 mL at  12/31/15 2200  . vancomycin (VANCOCIN) IVPB 1000 mg/200 mL premix  1,000 mg Intravenous Q12H Terri L Green, RPH        REVIEW OF SYSTEMS:   Constitutional: Denies fevers, chills or abnormal night sweats Eyes: Denies blurriness of vision, double vision or watery eyes Ears, nose, mouth, throat, and face: Denies mucositis or sore throat Respiratory: Denies cough, dyspnea or wheezes Cardiovascular: Denies palpitation, chest discomfort or lower extremity swelling Gastrointestinal:  Denies nausea, heartburn or change in bowel habits Skin: Denies abnormal skin rashes Lymphatics: Denies new lymphadenopathy or easy bruising Neurological:Denies numbness, tingling or new weaknesses Behavioral/Psych: Mood is stable, no new changes  All other systems were reviewed with the patient and are negative.  PHYSICAL EXAMINATION:  Filed  Vitals:   01/01/16 0800 01/01/16 0905  BP:  93/59  Pulse:  87  Temp: 100 F (37.8 C) 99.3 F (37.4 C)  Resp:  21   Filed Weights   12/31/15 1823 01/01/16 0325  Weight: 175 lb (79.379 kg) 174 lb 6.1 oz (79.1 kg)    GENERAL:alert, no distress and comfortable, appears pale  SKIN: skin color, texture, turgor are normal, no rashes or significant lesions EYES: normal, conjunctiva are pink and non-injected, sclera clear OROPHARYNX:no exudate, no erythema and lips, buccal mucosa, and tongue normal  NECK: supple, thyroid normal size, non-tender, without nodularity LYMPH:  no palpable lymphadenopathy in the cervical, axillary or inguinal LUNGS: clear to auscultation and percussion with normal breathing effort HEART: regular rate & rhythm and no murmurs and no lower extremity edema ABDOMEN:abdomen soft,  Mild tenderness on left size abdomen, note rebound. Musculoskeletal:no cyanosis of digits and no clubbing  PSYCH: alert & oriented x 3 with fluent speech NEURO: no focal motor/sensory deficits  LABORATORY DATA:  I have reviewed the data as listed CBC Latest Ref Rng 12/31/2015 12/31/2015 12/31/2015  WBC 4.0 - 10.5 K/uL - 13.5(H) 13.5(H)  Hemoglobin 12.0 - 15.0 g/dL - 3.7(LL) 3.6(LL)  Hematocrit 36.0 - 46.0 % - 13.8(L) 13.9(L)  Platelets 150 - 400 K/uL 103(L) 117(L) 118(L)    CMP Latest Ref Rng 12/31/2015  Glucose 65 - 99 mg/dL 143(H)  BUN 6 - 20 mg/dL 13  Creatinine 0.44 - 1.00 mg/dL 1.11(H)  Sodium 135 - 145 mmol/L 135  Potassium 3.5 - 5.1 mmol/L 2.8(L)  Chloride 101 - 111 mmol/L 102  CO2 22 - 32 mmol/L 18(L)  Calcium 8.9 - 10.3 mg/dL 9.4  Total Protein 6.5 - 8.1 g/dL 7.8  Total Bilirubin 0.3 - 1.2 mg/dL 0.6  Alkaline Phos 38 - 126 U/L 43  AST 15 - 41 U/L 40  ALT 14 - 54 U/L 13(L)     RADIOGRAPHIC STUDIES: I have personally reviewed the radiological images as listed and agreed with the findings in the report. Dg Chest 2 View  12/31/2015  CLINICAL DATA:  LEFT side chest pain  and abdomen, shortness of breath, fever and dry cough since yesterday EXAM: CHEST  2 VIEW COMPARISON:  02/12/2004 FINDINGS: Upper normal heart size. Normal mediastinal contours and pulmonary vascularity. Mild chronic central peribronchial thickening. No pulmonary infiltrate, pleural effusion or pneumothorax. Bones unremarkable. IMPRESSION: Mild chronic bronchitic changes without acute infiltrate. Electronically Signed   By: Lavonia Dana M.D.   On: 12/31/2015 15:32   US Transvaginal Non-ob  12/31/2015  CLINICAL DATA:  Nausea, vomiting. Question of fibroids and left ovarian cyst by CT today. Heavy bleeding. EXAM: TRANSABDOMINAL AND TRANSVAGINAL ULTRASOUND OF PELVIS TECHNIQUE: Both transabdominal and transvaginal ultrasound examinations of the  pelvis were performed. Transabdominal technique was performed for global imaging of the pelvis including uterus, ovaries, adnexal regions, and pelvic cul-de-sac. It was necessary to proceed with endovaginal exam following the transabdominal exam to visualize the endometrium. COMPARISON:  CT of the abdomen and pelvis 12/31/2015 FINDINGS: Uterus Measurements: Uterus is at least 14.5 x 9.8 x 9.5 cm. A circumscribed hypoechoic heterogeneous mass is identified in the central uterus, likely displacing the endometrial canal and it measures 6.7 x 5.5 x 5.3 cm. This is suspected to be a submucosal fibroid. Within the lower uterine segment there is a heterogeneous slightly hyperechoic mass in the central cervix measuring 5.0 x 3.0 x 5.2 cm. Small cystic lesion is identified in the posterior wall of the cervix measuring 2.7 x 1.0 x 2.1 cm. Endometrium Thickness: Obscured by fibroid, described above. Right ovary Measurements: 3.6 x 1.8 x 2.7 cm. Follicles are present; normal appearance. Left ovary Measurements: 8.9 x 6.2 x 5.7 cm. Normal ovarian tissue appears replaced by homogeneous hypoechoic and vascular tissue, suspicious for neoplasm. Other findings Trace free pelvic fluid.  IMPRESSION: 1. Complex appearance of the uterus. Probable central fibroid displaces the endometrial canal. Central cervical mass could represent a fibroid, complex nabothian cyst, or malignancy. Cystic lesion also noted in the posterior aspect of the cervix. 2. Left ovarian mass is suspicious for neoplasm. Further evaluation is recommended. 3. Consider MRI for further characterization of these findings prior to surgical evaluation. Electronically Signed   By: Nolon Nations M.D.   On: 12/31/2015 19:19   US Pelvis Complete  12/31/2015  CLINICAL DATA:  Nausea, vomiting. Question of fibroids and left ovarian cyst by CT today. Heavy bleeding. EXAM: TRANSABDOMINAL AND TRANSVAGINAL ULTRASOUND OF PELVIS TECHNIQUE: Both transabdominal and transvaginal ultrasound examinations of the pelvis were performed. Transabdominal technique was performed for global imaging of the pelvis including uterus, ovaries, adnexal regions, and pelvic cul-de-sac. It was necessary to proceed with endovaginal exam following the transabdominal exam to visualize the endometrium. COMPARISON:  CT of the abdomen and pelvis 12/31/2015 FINDINGS: Uterus Measurements: Uterus is at least 14.5 x 9.8 x 9.5 cm. A circumscribed hypoechoic heterogeneous mass is identified in the central uterus, likely displacing the endometrial canal and it measures 6.7 x 5.5 x 5.3 cm. This is suspected to be a submucosal fibroid. Within the lower uterine segment there is a heterogeneous slightly hyperechoic mass in the central cervix measuring 5.0 x 3.0 x 5.2 cm. Small cystic lesion is identified in the posterior wall of the cervix measuring 2.7 x 1.0 x 2.1 cm. Endometrium Thickness: Obscured by fibroid, described above. Right ovary Measurements: 3.6 x 1.8 x 2.7 cm. Follicles are present; normal appearance. Left ovary Measurements: 8.9 x 6.2 x 5.7 cm. Normal ovarian tissue appears replaced by homogeneous hypoechoic and vascular tissue, suspicious for neoplasm. Other  findings Trace free pelvic fluid. IMPRESSION: 1. Complex appearance of the uterus. Probable central fibroid displaces the endometrial canal. Central cervical mass could represent a fibroid, complex nabothian cyst, or malignancy. Cystic lesion also noted in the posterior aspect of the cervix. 2. Left ovarian mass is suspicious for neoplasm. Further evaluation is recommended. 3. Consider MRI for further characterization of these findings prior to surgical evaluation. Electronically Signed   By: Nolon Nations M.D.   On: 12/31/2015 19:19   Ct Abdomen Pelvis W Contrast  12/31/2015  CLINICAL DATA:  Three day history of left upper quadrant and left back pain with nausea and vomiting. EXAM: CT ABDOMEN AND PELVIS WITH CONTRAST TECHNIQUE: Multidetector  CT imaging of the abdomen and pelvis was performed using the standard protocol following bolus administration of intravenous contrast. CONTRAST:  155m OMNIPAQUE IOHEXOL 300 MG/ML  SOLN COMPARISON:  None. FINDINGS: Lower chest:  Unremarkable. Hepatobiliary: No focal abnormality within the liver parenchyma. 8 mm calcified stone identified in the gallbladder. No intrahepatic or extrahepatic biliary dilation. Pancreas: No focal mass lesion. No dilatation of the main duct. No intraparenchymal cyst. No peripancreatic edema. Spleen: No splenomegaly. No focal mass lesion. Adrenals/Urinary Tract: No adrenal nodule or mass. 6 mm hypodensity in the upper pole right kidney is too small to characterize but likely represents a cyst. Left kidney is unremarkable. No evidence for hydroureter. The urinary bladder appears normal for the degree of distention. Stomach/Bowel: Stomach is nondistended. No gastric wall thickening. No evidence of outlet obstruction. Duodenum is normally positioned as is the ligament of Treitz. No small bowel wall thickening. No small bowel dilatation. The terminal ileum is normal. The appendix is normal. Cecal tip is high in the right abdomen. No gross colonic  mass. No colonic wall thickening. No substantial diverticular change. Vascular/Lymphatic: No abdominal aortic aneurysm. No abdominal atherosclerotic calcification. There is no gastrohepatic or hepatoduodenal ligament lymphadenopathy. No intraperitoneal or retroperitoneal lymphadenopy. No pelvic sidewall lymphadenopathy. The gonadal vasculature is prominent bilaterally with perivascular edema/inflammation. Reproductive: Uterus appears markedly enlarged measuring 10.6 x 15.2 x 9.8 cm. Anterior intramural lesion measures 5.9 x 6.6 x 7.3 cm and displaces the endometrial cavity posteriorly. This is probably a degenerated fibroid. Left ovary is prominent with 4.1 x 5.8 cm cystic lesion within the parenchyma. The left gonadal vasculature is engorged. Right ovary is posteriorly positioned in the cul-de-sac and right gonadal vasculature is also quite prominent. Other: Trace free fluid is seen in the cul-de-sac. Musculoskeletal: Bone windows reveal no worrisome lytic or sclerotic osseous lesions. IMPRESSION: 1. Enlarged uterus with complex anterior intramural cystic lesion, presumably representing age degenerated, intramural anterior uterine fibroid with substantial mass effect on the endometrial stripe. Pelvic ultrasound may prove helpful to further evaluate. 2. Prominent bilateral gonadal vasculature with perivascular edema/inflammation. Gonadal vein thrombosis could cause this appearance, but lack of intravenous contrast on today's study hinders assessment. 3. Trace free fluid in the cul-de-sac. This can be physiologic in a premenopausal female. 4. 5.8 cm left ovarian cyst without evidence for internal septation or with asymmetric wall thickening by CT imaging. Pelvic ultrasound may prove helpful to further evaluate. 5. Cholelithiasis without biliary dilatation. Electronically Signed   By: EMisty StanleyM.D.   On: 12/31/2015 17:56    ASSESSMENT & PLAN:  40yo female presented with N/V, abdominal pain, heavy menstrual  period, fever and urinary frequency.   1. Severe microcytic hypo-productive anemia 2. Mild thrombocytopenia, possible related to bleeding  4. Sepsis  3. Fever and UTI  4. Hypotension likely secondary to sepsis   Plan -lab reviewed, no evidence of hemolysis (low ret, normal bili, normal LDH).  - I reviewed her peripheral blood smear this morning, which showed polychromasia,  Occasional schistocytes and tissue over cells. - her anemia is likely iron deficient secondary to heavy period. Iron study pending,  I spoke with lab technician, not able to added on. Please draw blood after her blood transfusion.  - I'll obtain hemoglobin electrophoresis also to ruled out hemoglobinopathy  -she is receiving blood transfusion, monitor CBC after transfusion -consult GYN for her  Menorrhagia , uterine fibroid, and ovary cyst -antibiotics for UTI  - continue IV fluids and supportive care for her sepsis.  I will follow up. If her iron level is low, please give one dose iv Feraheme in house, and I will give second dose next week in my clinic.     Truitt Merle, MD 01/01/2016

## 2015-12-31 NOTE — Progress Notes (Signed)
ANTIBIOTIC CONSULT NOTE - INITIAL  Pharmacy Consult for Vancomycin & Zosyn Indication: rule out sepsis  Allergies  Allergen Reactions  . Advil Pm [Ibuprofen-Diphenhydramine Cit] Hives   Patient Measurements: Height: 5\' 3"  (160 cm) Weight: 175 lb (79.379 kg) IBW/kg (Calculated) : 52.4  Vital Signs: Temp: 102.6 F (39.2 C) (01/11 1800) Temp Source: Oral (01/11 1800) BP: 92/56 mmHg (01/11 1920) Pulse Rate: 116 (01/11 1920) Intake/Output from previous day:   Intake/Output from this shift:    Labs:  Recent Labs  12/31/15 1410 12/31/15 1527  WBC 13.5* 13.5*  HGB 3.6* 3.7*  PLT 118* 117*  CREATININE 1.11*  --    Estimated Creatinine Clearance: 66.5 mL/min (by C-G formula based on Cr of 1.11). No results for input(s): VANCOTROUGH, VANCOPEAK, VANCORANDOM, GENTTROUGH, GENTPEAK, GENTRANDOM, TOBRATROUGH, TOBRAPEAK, TOBRARND, AMIKACINPEAK, AMIKACINTROU, AMIKACIN in the last 72 hours.   Microbiology: No results found for this or any previous visit (from the past 720 hour(s)).  Medical History: Past Medical History  Diagnosis Date  . Thyroid disease     hypo   Medications:  Scheduled:  . megestrol  40 mg Oral Daily  . potassium chloride  40 mEq Oral Once   Anti-infectives    Start     Dose/Rate Route Frequency Ordered Stop   12/31/15 2030  vancomycin (VANCOCIN) 1,500 mg in sodium chloride 0.9 % 500 mL IVPB     1,500 mg 250 mL/hr over 120 Minutes Intravenous  Once 12/31/15 2008     12/31/15 2015  piperacillin-tazobactam (ZOSYN) IVPB 3.375 g     3.375 g 100 mL/hr over 30 Minutes Intravenous  Once 12/31/15 2008       Assessment: 82 yoF with c/o nausea/shakes x 2 days. Hgb 3.7, Plt 117, plan transfusion. Per ED MD, vaginal bleeding, use po Megace per OB/Gyn recommendation. Febrile with leukocytosis, Vanc and Zosyn in ED, continue per Pharmacy.  Goal of Therapy:  Vancomycin trough level 15-20 mcg/ml  Plan:   Vancomycin 1500mg  x1, followed by 1gm q12  Zosyn 3.375gm  q8hr- 4 hr infusion    Gina Costilla L 12/31/2015,8:13 PM

## 2016-01-01 ENCOUNTER — Encounter (HOSPITAL_COMMUNITY): Payer: Self-pay | Admitting: Family Medicine

## 2016-01-01 DIAGNOSIS — R509 Fever, unspecified: Secondary | ICD-10-CM

## 2016-01-01 LAB — BASIC METABOLIC PANEL
ANION GAP: 8 (ref 5–15)
BUN: 11 mg/dL (ref 6–20)
CO2: 23 mmol/L (ref 22–32)
Calcium: 8.1 mg/dL — ABNORMAL LOW (ref 8.9–10.3)
Chloride: 105 mmol/L (ref 101–111)
Creatinine, Ser: 0.72 mg/dL (ref 0.44–1.00)
GFR calc Af Amer: >60 mL/min (ref >60)
GLUCOSE: 110 mg/dL — AB (ref 65–99)
POTASSIUM: 3.4 mmol/L — AB (ref 3.5–5.1)
Sodium: 136 mmol/L (ref 135–145)

## 2016-01-01 LAB — LACTIC ACID, PLASMA: Lactic Acid, Venous: 1.6 mmol/L (ref 0.5–2.0)

## 2016-01-01 LAB — SAVE SMEAR

## 2016-01-01 LAB — CBC
HCT: 24.3 % — ABNORMAL LOW (ref 36.0–46.0)
HEMATOCRIT: 22.6 % — AB (ref 36.0–46.0)
HEMOGLOBIN: 7.6 g/dL — AB (ref 12.0–15.0)
HEMOGLOBIN: 7.2 g/dL — AB (ref 12.0–15.0)
MCH: 23.2 pg — ABNORMAL LOW (ref 26.0–34.0)
MCH: 23.4 pg — ABNORMAL LOW (ref 26.0–34.0)
MCHC: 31.3 g/dL (ref 30.0–36.0)
MCHC: 31.9 g/dL (ref 30.0–36.0)
MCV: 74.1 fL — ABNORMAL LOW (ref 78.0–100.0)
MCV: 73.4 fL — AB (ref 78.0–100.0)
Platelets: 93 10*3/uL — ABNORMAL LOW (ref 150–400)
Platelets: 86 10*3/uL — ABNORMAL LOW (ref 150–400)
RBC: 3.28 MIL/uL — AB (ref 3.87–5.11)
RBC: 3.08 MIL/uL — AB (ref 3.87–5.11)
RDW: 25.9 % — ABNORMAL HIGH (ref 11.5–15.5)
RDW: 25.7 % — ABNORMAL HIGH (ref 11.5–15.5)
WBC: 15.4 10*3/uL — ABNORMAL HIGH (ref 4.0–10.5)
WBC: 13.3 10*3/uL — AB (ref 4.0–10.5)

## 2016-01-01 LAB — MRSA PCR SCREENING: MRSA BY PCR: NEGATIVE

## 2016-01-01 LAB — IRON AND TIBC
Iron: 9 ug/dL — ABNORMAL LOW (ref 28–170)
Saturation Ratios: 2 % — ABNORMAL LOW (ref 10.4–31.8)
TIBC: 521 ug/dL — AB (ref 250–450)
UIBC: 512 ug/dL

## 2016-01-01 LAB — MAGNESIUM: MAGNESIUM: 1.7 mg/dL (ref 1.7–2.4)

## 2016-01-01 LAB — FERRITIN: Ferritin: 22 ng/mL (ref 11–307)

## 2016-01-01 MED ORDER — SODIUM CHLORIDE 0.9 % IV BOLUS (SEPSIS)
1000.0000 mL | Freq: Once | INTRAVENOUS | Status: AC
  Administered 2016-01-01: 1000 mL via INTRAVENOUS

## 2016-01-01 MED ORDER — POTASSIUM CHLORIDE CRYS ER 20 MEQ PO TBCR
40.0000 meq | EXTENDED_RELEASE_TABLET | Freq: Once | ORAL | Status: AC
  Administered 2016-01-01: 40 meq via ORAL
  Filled 2016-01-01: qty 2

## 2016-01-01 MED ORDER — SODIUM CHLORIDE 0.9 % IV BOLUS (SEPSIS): 500.0000 mL | Freq: Once | INTRAVENOUS | Status: DC

## 2016-01-01 NOTE — Care Management Note (Signed)
Case Management Note  Patient Details  Name: Alicia Carson MRN: [id number] Date of Birth: 1976/05/25  Subjective/Objective:          sepsis          Action/Plan:  Date: January 01, 2016 Chart reviewed for concurrent status and case management needs. Will continue to follow patient for changes and needs: Velva Harman, RN, BSN, [place]   [phone] Expected Discharge Date:   (unknown)               Expected Discharge Plan:  Home/Self Care  In-House Referral:  NA  Discharge planning Services  CM Consult  Post Acute Care Choice:  NA Choice offered to:  NA  DME Arranged:    DME Agency:     HH Arranged:    HH Agency:     Status of Service:  In process, will continue to follow  Medicare Important Message Given:    Date Medicare IM Given:    Medicare IM give by:    Date Additional Medicare IM Given:    Additional Medicare Important Message give by:     If discussed at [place] of Stay Meetings, dates discussed:    Additional Comments:  Leeroy Cha, RN 01/01/2016, 10:19 AM

## 2016-01-01 NOTE — Evaluation (Signed)
Occupational Therapy Evaluation Patient Details Name: Alicia Carson MRN: [id number] DOB: [birth year] Today's Date: 01/01/2016    History of Present Illness Pt was admitted with abdominal pain, nausea, vomiting, increased urinary frequency and fever.  Dx'd with uti and sepsis.  Hgb 3.6 on admission; s/p 4 units PRBC.  Pt with fibroids & has L ovarian mass/cyst.  h/o heavy menses   Clinical Impression   This 40 year old female was admitted for the above. At baseline, she is independent with adls/iadls.  She currently needs up to max A for LB adls due to pain.  Goals in acute are for min guard level    Follow Up Recommendations  No OT follow up    Equipment Recommendations   (will further assess standard commode)    Recommendations for Other Services       Precautions / Restrictions Precautions Precautions: Fall Precaution Comments: monitor HR Restrictions Weight Bearing Restrictions: No      Mobility Bed Mobility Overal bed mobility: Needs Assistance Bed Mobility: Supine to Sit     Supine to sit: Supervision     General bed mobility comments: HOB raised and heavy reliance on bed rails  Transfers Overall transfer level: Needs assistance Equipment used: 1 person hand held assist Transfers: Sit to/from Stand Sit to Stand: Min guard         General transfer comment: for safety    Balance Overall balance assessment: Needs assistance         Standing balance support: During functional activity;No upper extremity supported Standing balance-Leahy Scale: Fair Standing balance comment: steadying assistance when walking                            ADL Overall ADL's : Needs assistance/impaired     Grooming: Standing;Supervision/safety   Upper Body Bathing: Set up;Sitting   Lower Body Bathing: Sit to/from stand   Upper Body Dressing : Set up;Sitting   Lower Body Dressing: Maximal assistance;Sit to/from stand   Toilet Transfer:  Ambulation;Minimal assistance (to chair)   Toileting- Water quality scientist and Hygiene: Min guard;Sit to/from stand         General ADL Comments: pt limited with adls due to pain. She states she can borrow her mother's reacher.  Educated to sponge bathe initially unless she feels that balance/strength are back at time of d/c. will further assess commode     Vision     Perception     Praxis      Pertinent Vitals/Pain Pain Assessment: 0-10 Pain Score: 5  Pain Location: l lower abdomen Pain Descriptors / Indicators: Aching;Discomfort;Guarding Pain Intervention(s): Monitored during session     Hand Dominance     Extremity/Trunk Assessment        Cervical / Trunk Assessment Cervical / Trunk Assessment: Normal   Communication Communication Communication: No difficulties   Cognition Arousal/Alertness: Awake/alert Behavior During Therapy: WFL for tasks assessed/performed Overall Cognitive Status: Within Functional Limits for tasks assessed                     General Comments       Exercises       Shoulder Instructions      Home Living Family/patient expects to be discharged to:: Private residence Living Arrangements: Children Available Help at Discharge: Family Type of Home: Apartment Home Access: Stairs to enter Technical brewer of Steps: flight Entrance Stairs-Rails: Right;Left Home Layout: One level     Bathroom  Shower/Tub: Teacher, early years/pre: Standard     Home Equipment: None   Additional Comments: children are 19 and 24; work and go to school      Prior Functioning/Environment Level of Independence: Independent             OT Diagnosis: Generalized weakness   OT Problem List: Decreased strength;Decreased knowledge of use of DME or AE;Pain   OT Treatment/Interventions: Self-care/ADL training;DME and/or AE instruction;Patient/family education    OT Goals(Current goals can be found in the care plan section)  Acute Rehab OT Goals Patient Stated Goal: decreased pain and get back to independence OT Goal Formulation: With patient Time For Goal Achievement: 01/08/16 Potential to Achieve Goals: Good ADL Goals Pt Will Perform Lower Body Bathing: with set-up;with adaptive equipment;sit to/from stand Pt Will Perform Lower Body Dressing: with set-up;with adaptive equipment;sit to/from stand Pt Will Transfer to Toilet: with modified independence;regular height toilet;bedside commode;ambulating  OT Frequency: Min 2X/week   Barriers to D/C:            Co-evaluation PT/OT/SLP Co-Evaluation/Treatment: Yes Reason for Co-Treatment: For patient/therapist safety PT goals addressed during session: Mobility/safety with mobility OT goals addressed during session: ADL's and self-care      End of Session Nurse Communication: Mobility status  Activity Tolerance: Patient tolerated treatment well Patient left: in chair;with call bell/phone within reach   Time: [id number] OT Time Calculation (min): 17 min Charges:  OT General Charges $OT Visit: 1 Procedure OT Evaluation $OT Eval Low Complexity: 1 Procedure G-Codes:    Brandon Wiechman 2016/01/30, 1:08 PM  Lesle Chris, OTR/L [phone] 01/30/16

## 2016-01-01 NOTE — Evaluation (Signed)
Physical Therapy Evaluation Patient Details Name: Alicia Carson MRN: [id number] DOB: [birth year] Today's Date: 01/01/2016   History of Present Illness  Pt was admitted with abdominal pain, nausea, vomiting, increased urinary frequency and fever.  Dx'd with uti and sepsis.  Hgb 3.6 on admission; s/p 4 units PRBC.  Pt with fibroids & has L ovarian mass/cyst.  h/o heavy menses  Clinical Impression  Patient is  A bit weak but     Follow Up Recommendations No PT follow up    Equipment Recommendations  None recommended by PT    Recommendations for Other Services       Precautions / Restrictions Precautions Precautions: Fall Precaution Comments: monitor HR Restrictions Weight Bearing Restrictions: No      Mobility  Bed Mobility Overal bed mobility: Needs Assistance Bed Mobility: Supine to Sit     Supine to sit: Supervision     General bed mobility comments: HOB raised and heavy reliance on bed rails  Transfers Overall transfer level: Needs assistance Equipment used: 1 person hand held assist Transfers: Sit to/from Stand Sit to Stand: Min guard         General transfer comment: steadying assistance  Ambulation/Gait Ambulation/Gait assistance: Min guard Ambulation Distance (Feet): 300 Feet Assistive device: 1 person hand held assist Gait Pattern/deviations: Step-through pattern;Decreased stride length     General Gait Details: mildly  unbalanced at times with turns  Science writer    Modified Rankin (Stroke Patients Only)       Balance Overall balance assessment: Needs assistance         Standing balance support: During functional activity;No upper extremity supported Standing balance-Leahy Scale: Fair Standing balance comment: steadying assistance when walking                             Pertinent Vitals/Pain Pain Assessment: 0-10 Pain Score: 5  Pain Location: l lower abdomen Pain Descriptors /  Indicators: Aching;Discomfort;Guarding Pain Intervention(s): Monitored during session    Home Living Family/patient expects to be discharged to:: Private residence Living Arrangements: Children Available Help at Discharge: Family Type of Home: Apartment Home Access: Stairs to enter Entrance Stairs-Rails: Psychiatric nurse of Steps: flight Home Layout: One level Home Equipment: None Additional Comments: children are 19 and 24; work and go to school    Prior Function Level of Independence: Independent               Journalist, newspaper        Extremity/Trunk Assessment               Lower Extremity Assessment: Generalized weakness      Cervical / Trunk Assessment: Normal  Communication   Communication: No difficulties  Cognition Arousal/Alertness: Awake/alert Behavior During Therapy: WFL for tasks assessed/performed Overall Cognitive Status: Within Functional Limits for tasks assessed                      General Comments      Exercises        Assessment/Plan    PT Assessment Patient needs continued PT services  PT Diagnosis Difficulty walking;Generalized weakness;Acute pain   PT Problem List Decreased strength;Decreased activity tolerance;Pain  PT Treatment Interventions Gait training;Stair training;Functional mobility training   PT Goals (Current goals can be found in the Care Plan section) Acute Rehab PT Goals Patient Stated Goal: decreased pain and  get back to independence PT Goal Formulation: With patient Time For Goal Achievement: 01/08/16 Potential to Achieve Goals: Good    Frequency Min 3X/week   Barriers to discharge        Co-evaluation PT/OT/SLP Co-Evaluation/Treatment: Yes Reason for Co-Treatment: For patient/therapist safety PT goals addressed during session: Mobility/safety with mobility OT goals addressed during session: ADL's and self-care       End of Session   Activity Tolerance: Patient tolerated  treatment well Patient left: in chair;with call bell/phone within reach Nurse Communication: Mobility status         Time: [id number] PT Time Calculation (min) (ACUTE ONLY): 17 min   Charges:   PT Evaluation $PT Eval Low Complexity: 1 Procedure     PT G CodesClaretha Cooper 01/01/2016, 1:08 PM

## 2016-01-01 NOTE — Progress Notes (Signed)
[facility] PROGRESS NOTE  Alicia Carson [id number] DOB: 1976/06/16 DOA: 12/31/2015 PCP: No primary care provider on file.  Assessment/Plan: 1-Severe anemia; secondary to menorrhagia. Patient denies blood in the stool. Occult blood positive. At some point she will need GI evaluation.  Getting 4 units of PRBC. Repeat hb after transfusion. Further transfusion as needed.  GYN , Dr Thornton Papas was consulted by ED. Awaiting recommendation.  Patient was started on Megestrol.  She will need Iron supplement at discharge.  LDH normal, fibrinogen elevated, less likely DIC. Anemia panel pending.   2-Sepsis: Presents with fever, leukocytosis , hypotension.  Probably related to UTI. Chest x ray with bronchitis changes. UA with positive nitrates, 6--30 WBC.  Continue with vancomycin and zosyn for now. Follow urine culture.  IV fluids.  Lactic acid has decrease form 4 to 1.   3-Left ovarian Mass, cyst, complex appearance of the uterus, central cervical mass could represent fibroid;  Oncology and GYN consulted.   4-Hypokalemia; received oral and IV supplement. repeat labs this morning.    Code Status: Full code.  Family Communication: care discussed with patient.  Disposition Plan: Remain in the step down for treatment of severe anemia, and sepsis.    Consultants:  GYN  Hematology  Procedures:  none  Antibiotics:  Vancomycin 1-11  Zosyn 1-11    HPI/Subjective: Patient is feeling better. During christmas she had viral illness, after that she continue to feel poorly.  Her menstrual period last 5 to 7 days. She report heavy menstrual periods on and off. Use 3 to 4 pads.   Objective: Filed Vitals:   01/01/16 0620 01/01/16 0700  BP: 87/54 95/57  Pulse: 91 92  Temp: 99.9 F (37.7 C)   Resp: 24 26    Intake/Output Summary (Last 24 hours) at 01/01/16 0758 Last data filed at 01/01/16 0700  Gross per 24 hour  Intake   1110 ml  Output      0 ml  Net   1110 ml    Filed Weights   12/31/15 1823 01/01/16 0325  Weight: 79.379 kg (175 lb) 79.1 kg (174 lb 6.1 oz)    Exam:   General:  Alert in no distress  Cardiovascular: S 1, S 2 RRR  Respiratory: CTA  Abdomen: BS decreases, distended, mild tenderness  Musculoskeletal: no edema, scd in place  Data Reviewed: Basic Metabolic Panel:  Recent Labs Lab 12/31/15 1410  NA 135  K 2.8*  CL 102  CO2 18*  GLUCOSE 143*  BUN 13  CREATININE 1.11*  CALCIUM 9.4   Liver Function Tests:  Recent Labs Lab 12/31/15 1410  AST 40  ALT 13*  ALKPHOS 43  BILITOT 0.6  PROT 7.8  ALBUMIN 3.6    Recent Labs Lab 12/31/15 1410  LIPASE 26   No results for input(s): AMMONIA in the last 168 hours. CBC:  Recent Labs Lab 12/31/15 1410 12/31/15 1527 12/31/15 2103  WBC 13.5* 13.5*  --   NEUTROABS 12.9* 12.5*  --   HGB 3.6* 3.7*  --   HCT 13.9* 13.8*  --   MCV 62.3* 61.6*  --   PLT 118* 117* 103*   Cardiac Enzymes: No results for input(s): CKTOTAL, CKMB, CKMBINDEX, TROPONINI in the last 168 hours. BNP (last 3 results) No results for input(s): BNP in the last 8760 hours.  ProBNP (last 3 results) No results for input(s): PROBNP in the last 8760 hours.  CBG:  Recent Labs Lab 12/31/15 1405  GLUCAP 134*  Recent Results (from the past 240 hour(s))  MRSA PCR Screening     Status: None   Collection Time: 01/01/16  3:25 AM  Result Value Ref Range Status   MRSA by PCR NEGATIVE NEGATIVE Final    Comment:        The GeneXpert MRSA Assay (FDA approved for NASAL specimens only), is one component of a comprehensive MRSA colonization surveillance program. It is not intended to diagnose MRSA infection nor to guide or monitor treatment for MRSA infections.      Studies: Dg Chest 2 View  12/31/2015  CLINICAL DATA:  LEFT side chest pain and abdomen, shortness of breath, fever and dry cough since yesterday EXAM: CHEST  2 VIEW COMPARISON:  02/12/2004 FINDINGS: Upper normal heart size.  Normal mediastinal contours and pulmonary vascularity. Mild chronic central peribronchial thickening. No pulmonary infiltrate, pleural effusion or pneumothorax. Bones unremarkable. IMPRESSION: Mild chronic bronchitic changes without acute infiltrate. Electronically Signed   By: Lavonia Dana M.D.   On: 12/31/2015 15:32   US Transvaginal Non-ob  12/31/2015  CLINICAL DATA:  Nausea, vomiting. Question of fibroids and left ovarian cyst by CT today. Heavy bleeding. EXAM: TRANSABDOMINAL AND TRANSVAGINAL ULTRASOUND OF PELVIS TECHNIQUE: Both transabdominal and transvaginal ultrasound examinations of the pelvis were performed. Transabdominal technique was performed for global imaging of the pelvis including uterus, ovaries, adnexal regions, and pelvic cul-de-sac. It was necessary to proceed with endovaginal exam following the transabdominal exam to visualize the endometrium. COMPARISON:  CT of the abdomen and pelvis 12/31/2015 FINDINGS: Uterus Measurements: Uterus is at least 14.5 x 9.8 x 9.5 cm. A circumscribed hypoechoic heterogeneous mass is identified in the central uterus, likely displacing the endometrial canal and it measures 6.7 x 5.5 x 5.3 cm. This is suspected to be a submucosal fibroid. Within the lower uterine segment there is a heterogeneous slightly hyperechoic mass in the central cervix measuring 5.0 x 3.0 x 5.2 cm. Small cystic lesion is identified in the posterior wall of the cervix measuring 2.7 x 1.0 x 2.1 cm. Endometrium Thickness: Obscured by fibroid, described above. Right ovary Measurements: 3.6 x 1.8 x 2.7 cm. Follicles are present; normal appearance. Left ovary Measurements: 8.9 x 6.2 x 5.7 cm. Normal ovarian tissue appears replaced by homogeneous hypoechoic and vascular tissue, suspicious for neoplasm. Other findings Trace free pelvic fluid. IMPRESSION: 1. Complex appearance of the uterus. Probable central fibroid displaces the endometrial canal. Central cervical mass could represent a fibroid,  complex nabothian cyst, or malignancy. Cystic lesion also noted in the posterior aspect of the cervix. 2. Left ovarian mass is suspicious for neoplasm. Further evaluation is recommended. 3. Consider MRI for further characterization of these findings prior to surgical evaluation. Electronically Signed   By: Nolon Nations M.D.   On: 12/31/2015 19:19   US Pelvis Complete  12/31/2015  CLINICAL DATA:  Nausea, vomiting. Question of fibroids and left ovarian cyst by CT today. Heavy bleeding. EXAM: TRANSABDOMINAL AND TRANSVAGINAL ULTRASOUND OF PELVIS TECHNIQUE: Both transabdominal and transvaginal ultrasound examinations of the pelvis were performed. Transabdominal technique was performed for global imaging of the pelvis including uterus, ovaries, adnexal regions, and pelvic cul-de-sac. It was necessary to proceed with endovaginal exam following the transabdominal exam to visualize the endometrium. COMPARISON:  CT of the abdomen and pelvis 12/31/2015 FINDINGS: Uterus Measurements: Uterus is at least 14.5 x 9.8 x 9.5 cm. A circumscribed hypoechoic heterogeneous mass is identified in the central uterus, likely displacing the endometrial canal and it measures 6.7 x 5.5 x 5.3  cm. This is suspected to be a submucosal fibroid. Within the lower uterine segment there is a heterogeneous slightly hyperechoic mass in the central cervix measuring 5.0 x 3.0 x 5.2 cm. Small cystic lesion is identified in the posterior wall of the cervix measuring 2.7 x 1.0 x 2.1 cm. Endometrium Thickness: Obscured by fibroid, described above. Right ovary Measurements: 3.6 x 1.8 x 2.7 cm. Follicles are present; normal appearance. Left ovary Measurements: 8.9 x 6.2 x 5.7 cm. Normal ovarian tissue appears replaced by homogeneous hypoechoic and vascular tissue, suspicious for neoplasm. Other findings Trace free pelvic fluid. IMPRESSION: 1. Complex appearance of the uterus. Probable central fibroid displaces the endometrial canal. Central cervical mass  could represent a fibroid, complex nabothian cyst, or malignancy. Cystic lesion also noted in the posterior aspect of the cervix. 2. Left ovarian mass is suspicious for neoplasm. Further evaluation is recommended. 3. Consider MRI for further characterization of these findings prior to surgical evaluation. Electronically Signed   By: Nolon Nations M.D.   On: 12/31/2015 19:19   Ct Abdomen Pelvis W Contrast  12/31/2015  CLINICAL DATA:  Three day history of left upper quadrant and left back pain with nausea and vomiting. EXAM: CT ABDOMEN AND PELVIS WITH CONTRAST TECHNIQUE: Multidetector CT imaging of the abdomen and pelvis was performed using the standard protocol following bolus administration of intravenous contrast. CONTRAST:  172mL OMNIPAQUE IOHEXOL 300 MG/ML  SOLN COMPARISON:  None. FINDINGS: Lower chest:  Unremarkable. Hepatobiliary: No focal abnormality within the liver parenchyma. 8 mm calcified stone identified in the gallbladder. No intrahepatic or extrahepatic biliary dilation. Pancreas: No focal mass lesion. No dilatation of the main duct. No intraparenchymal cyst. No peripancreatic edema. Spleen: No splenomegaly. No focal mass lesion. Adrenals/Urinary Tract: No adrenal nodule or mass. 6 mm hypodensity in the upper pole right kidney is too small to characterize but likely represents a cyst. Left kidney is unremarkable. No evidence for hydroureter. The urinary bladder appears normal for the degree of distention. Stomach/Bowel: Stomach is nondistended. No gastric wall thickening. No evidence of outlet obstruction. Duodenum is normally positioned as is the ligament of Treitz. No small bowel wall thickening. No small bowel dilatation. The terminal ileum is normal. The appendix is normal. Cecal tip is high in the right abdomen. No gross colonic mass. No colonic wall thickening. No substantial diverticular change. Vascular/Lymphatic: No abdominal aortic aneurysm. No abdominal atherosclerotic calcification.  There is no gastrohepatic or hepatoduodenal ligament lymphadenopathy. No intraperitoneal or retroperitoneal lymphadenopy. No pelvic sidewall lymphadenopathy. The gonadal vasculature is prominent bilaterally with perivascular edema/inflammation. Reproductive: Uterus appears markedly enlarged measuring 10.6 x 15.2 x 9.8 cm. Anterior intramural lesion measures 5.9 x 6.6 x 7.3 cm and displaces the endometrial cavity posteriorly. This is probably a degenerated fibroid. Left ovary is prominent with 4.1 x 5.8 cm cystic lesion within the parenchyma. The left gonadal vasculature is engorged. Right ovary is posteriorly positioned in the cul-de-sac and right gonadal vasculature is also quite prominent. Other: Trace free fluid is seen in the cul-de-sac. Musculoskeletal: Bone windows reveal no worrisome lytic or sclerotic osseous lesions. IMPRESSION: 1. Enlarged uterus with complex anterior intramural cystic lesion, presumably representing age degenerated, intramural anterior uterine fibroid with substantial mass effect on the endometrial stripe. Pelvic ultrasound may prove helpful to further evaluate. 2. Prominent bilateral gonadal vasculature with perivascular edema/inflammation. Gonadal vein thrombosis could cause this appearance, but lack of intravenous contrast on today's study hinders assessment. 3. Trace free fluid in the cul-de-sac. This can be physiologic in a  premenopausal female. 4. 5.8 cm left ovarian cyst without evidence for internal septation or with asymmetric wall thickening by CT imaging. Pelvic ultrasound may prove helpful to further evaluate. 5. Cholelithiasis without biliary dilatation. Electronically Signed   By: Misty Stanley M.D.   On: 12/31/2015 17:56    Scheduled Meds: . megestrol  40 mg Oral Daily  . multivitamin with minerals  1 tablet Oral Daily  . pantoprazole (PROTONIX) IV  40 mg Intravenous Q12H  . piperacillin-tazobactam (ZOSYN)  IV  3.375 g Intravenous Q8H  . sodium chloride  1,000 mL  Intravenous Once  . sodium chloride  3 mL Intravenous Q12H  . vancomycin  1,000 mg Intravenous Q12H   Continuous Infusions: . sodium chloride 75 mL/hr at 12/31/15 2258    Principal Problem:   Symptomatic anemia Active Problems:   Heavy menstrual period   UTI (lower urinary tract infection)   Sepsis (HCC)   Fibroid   Fever   Nausea & vomiting   Abdominal pain   Hypokalemia    Time spent: 35 minutes.     Niel Hummer A  [facility] Pager [phone]. If 7PM-7AM, please contact night-coverage at [email or web address], password [facility] 01/01/2016, 7:58 AM  LOS: 1 day

## 2016-01-01 NOTE — Consult Note (Signed)
CONSULT NOTE  History:  40 y.o. G2P2 seen in consultation for heavy vaginal bleeding.  The patient has known history of fibroids, diagnosed in 2010 by her primary OBGYN ([place]) but she has not seen them in over 3 years. She reports regular cycles every 30d with 5-7 days of bleeding. She uses 2-3 super thick pads daily for the majority of days of her cycle.  She reports she started her regular cycle on 1/8 and noticed increased blood with the use of 4 pads in one day. She was not changing pads every hour but was using thick super plus pads and reported clots. She has cramping with all periods but the pain was different in nature and more intense. She reports she also had vomiting on Monday 1/9 which never happens with her cycle. She stayed at home for 2 days but on Wednesday presented to the emergency room due to fevers, rigors and weakness.   Patient reports fatigue in the past several months with acute worsening with this cycle. She reports she "feels like she is pregnant." Endorses early satiety for the past year that is intermittent and she associated with GERD. She denies unexpected weight loss. She reports feeling bloated during her cycle.   Last pap smear was ~3-4 years ago and was "abnormal" but reports returning in a couple weeks and it was "normal." She has history of abnormal pap smears and had a LEEP procedures in 2006.   She denies any abnormal vaginal discharge,pelvic pain or other concerns.   Past Medical History  Diagnosis Date  . Thyroid disease     hypo    Past Surgical History  Procedure Laterality Date  . Neck surgery Left 2005   Family History  Problem Relation Age of Onset  . Anemia Mother   . Hypertension Father   . Hypertension Sister   . Breast cancer Cousin     Mother's cousin had breast cancer and 5 of her daughters also have cancer  . Breast cancer Cousin   . Breast cancer Cousin   . Uterine cancer Neg Hx   . Colon cancer Neg Hx   . Ovarian cancer Neg Hx       The following portions of the patient's history were reviewed and updated as appropriate: allergies, current medications, past family history, past medical history, past social history, past surgical history and problem list.   Health Maintenance:   Last pap ~4 years ago and was "abnormal"  Review of Systems:  Pertinent items noted in HPI and remainder of comprehensive ROS otherwise negative. Review of Systems  Constitutional: Positive for fever, chills and malaise/fatigue. Negative for weight loss.  HENT: Positive for congestion (cold sx in December).   Eyes: Negative for blurred vision.  Respiratory: Negative for cough and sputum production.   Cardiovascular: Negative for chest pain and leg swelling.  Gastrointestinal: Positive for heartburn, vomiting, abdominal pain and melena (1-2 times in the past year, not in the last 2 weeks). Negative for diarrhea and constipation.  Genitourinary: Positive for frequency and flank pain (Left side aching). Negative for dysuria and hematuria.  Skin: Negative for rash.  Neurological: Positive for weakness. Negative for dizziness.  Endo/Heme/Allergies: Does not bruise/bleed easily.    Objective:  Physical Exam BP 104/67 mmHg  Pulse 88  Temp(Src) 98.7 F (37.1 C) (Oral)  Resp 17  Ht 5\' 5"  (1.651 m)  Wt 174 lb 6.1 oz (79.1 kg)  BMI 29.02 kg/m2  SpO2 100%  LMP 12/28/2015 (Exact Date)  CONSTITUTIONAL: Well-developed, well-nourished female in no acute distress.  HENT:  Normocephalic, atraumatic. External right and left ear normal. Oropharynx is clear and moist EYES: Conjunctivae and EOM are normal. Pupils are equal, round, and reactive to light. No scleral icterus.  NECK: Normal range of motion, supple, no masses SKIN: Skin is warm and dry. No rash noted. Not diaphoretic. No erythema. No pallor. [place]: Alert and oriented to person, place, and time. Normal reflexes, muscle tone coordination. No cranial nerve deficit noted. PSYCHIATRIC:  Normal mood and affect. Normal behavior. Normal judgment and thought content. CARDIOVASCULAR: Normal heart rate noted RESPIRATORY: Effort and breath sounds normal, no problems with respiration noted ABDOMEN: Soft, no distention noted.  + soreness/TTP over LLQ PELVIC: Normal appearing external genitalia; Bimanual exam- no CMT.  Enlarged uterus ~16wk size. L adnexal pain and fullness. MUSCULOSKELETAL: Normal range of motion. No edema noted.  Labs and Imaging US Transvaginal Non-ob 12/31/2015  CLINICAL DATA:  Nausea, vomiting. Question of fibroids and left ovarian cyst by CT today. Heavy bleeding. EXAM: TRANSABDOMINAL AND TRANSVAGINAL ULTRASOUND OF PELVIS TECHNIQUE: Both transabdominal and transvaginal ultrasound examinations of the pelvis were performed. Transabdominal technique was performed for global imaging of the pelvis including uterus, ovaries, adnexal regions, and pelvic cul-de-sac. It was necessary to proceed with endovaginal exam following the transabdominal exam to visualize the endometrium. COMPARISON:  CT of the abdomen and pelvis 12/31/2015 FINDINGS: Uterus Measurements: Uterus is at least 14.5 x 9.8 x 9.5 cm. A circumscribed hypoechoic heterogeneous mass is identified in the central uterus, likely displacing the endometrial canal and it measures 6.7 x 5.5 x 5.3 cm. This is suspected to be a submucosal fibroid. Within the lower uterine segment there is a heterogeneous slightly hyperechoic mass in the central cervix measuring 5.0 x 3.0 x 5.2 cm. Small cystic lesion is identified in the posterior wall of the cervix measuring 2.7 x 1.0 x 2.1 cm. Endometrium Thickness: Obscured by fibroid, described above. Right ovary Measurements: 3.6 x 1.8 x 2.7 cm. Follicles are present; normal appearance. Left ovary Measurements: 8.9 x 6.2 x 5.7 cm. Normal ovarian tissue appears replaced by homogeneous hypoechoic and vascular tissue, suspicious for neoplasm. Other findings Trace free pelvic fluid.  IMPRESSION:    1. Complex appearance of the uterus. Probable central fibroid displaces the endometrial canal. Central cervical mass could represent a fibroid, complex nabothian cyst, or malignancy. Cystic lesion also noted in the posterior aspect of the cervix.  2. Left ovarian mass is suspicious for neoplasm. Further evaluation is recommended.  3. Consider MRI for further characterization of these findings prior to surgical evaluation. Electronically Signed   By: Nolon Nations M.D.   On: 12/31/2015 19:19   CT Abdomen Pelvis W Contrast 12/31/2015  CLINICAL DATA:  Three day history of left upper quadrant and left back pain with nausea and vomiting. EXAM: CT ABDOMEN AND PELVIS WITH CONTRAST TECHNIQUE: Multidetector CT imaging of the abdomen and pelvis was performed using the standard protocol following bolus administration of intravenous contrast. CONTRAST:  132mL OMNIPAQUE IOHEXOL 300 MG/ML  SOLN COMPARISON:  None. FINDINGS: Lower chest:  Unremarkable. Hepatobiliary: No focal abnormality within the liver parenchyma. 8 mm calcified stone identified in the gallbladder. No intrahepatic or extrahepatic biliary dilation. Pancreas: No focal mass lesion. No dilatation of the main duct. No intraparenchymal cyst. No peripancreatic edema. Spleen: No splenomegaly. No focal mass lesion. Adrenals/Urinary Tract: No adrenal nodule or mass. 6 mm hypodensity in the upper pole right kidney is too small to characterize but likely represents  a cyst. Left kidney is unremarkable. No evidence for hydroureter. The urinary bladder appears normal for the degree of distention. Stomach/Bowel: Stomach is nondistended. No gastric wall thickening. No evidence of outlet obstruction. Duodenum is normally positioned as is the ligament of Treitz. No small bowel wall thickening. No small bowel dilatation. The terminal ileum is normal. The appendix is normal. Cecal tip is high in the right abdomen. No gross colonic mass. No colonic wall thickening. No  substantial diverticular change. Vascular/Lymphatic: No abdominal aortic aneurysm. No abdominal atherosclerotic calcification. There is no gastrohepatic or hepatoduodenal ligament lymphadenopathy. No intraperitoneal or retroperitoneal lymphadenopy. No pelvic sidewall lymphadenopathy. The gonadal vasculature is prominent bilaterally with perivascular edema/inflammation. Reproductive: Uterus appears markedly enlarged measuring 10.6 x 15.2 x 9.8 cm. Anterior intramural lesion measures 5.9 x 6.6 x 7.3 cm and displaces the endometrial cavity posteriorly. This is probably a degenerated fibroid. Left ovary is prominent with 4.1 x 5.8 cm cystic lesion within the parenchyma. The left gonadal vasculature is engorged. Right ovary is posteriorly positioned in the cul-de-sac and right gonadal vasculature is also quite prominent. Other: Trace free fluid is seen in the cul-de-sac. Musculoskeletal: Bone windows reveal no worrisome lytic or sclerotic osseous lesions. IMPRESSION:  1. Enlarged uterus with complex anterior intramural cystic lesion, presumably representing age degenerated, intramural anterior uterine fibroid with substantial mass effect on the endometrial stripe. Pelvic ultrasound may prove helpful to further evaluate.  2. Prominent bilateral gonadal vasculature with perivascular edema/inflammation. Gonadal vein thrombosis could cause this appearance, but lack of intravenous contrast on today's study hinders assessment.  3. Trace free fluid in the cul-de-sac. This can be physiologic in a premenopausal female.  4. 5.8 cm left ovarian cyst without evidence for internal septation or with asymmetric wall thickening by CT imaging. Pelvic ultrasound may prove helpful to further evaluate.  5. Cholelithiasis without biliary dilatation.  Electronically Signed   By: Misty Stanley M.D.   On: 12/31/2015 17:56    Lab Results  Component Value Date   HGB 7.2* 01/01/2016   HGB 7.6* 01/01/2016   HGB 3.7* 12/31/2015     Assessment & Plan:   Alicia Carson is a 40 y.o. G2P2 presenting with profound anemia in the setting of longstanding menorrhagia with a regular cycle. CT and ultrasound imaging is concerning for pelvic pathology. DDX for central/predominant fibroid with heavy bleeding would include leiomyosarcoma vs degenerating fibroid.  Also concerning ovarian pathology with a complex cyst on the left which is the location of the patient's discomfort.  Additionally the patient has a poor pap smear history and is not up to date on screenings.  - Recommend outpatient pap and endometrial sampling. Will contact [facility] to schedule appt in 2-3wk. - CA-125 to help rule out ovarian pathology/assist in further work up.  - Recommend involving GYN/ONC given concern for uterine/ovarian pathology. We will facilitate this consult.  - Patient reported melena 1-2 times in the past years and GERD sx and was FOBT positive, agree with primary team that patient should have work up for upper GI source of bleeding though GYN bleeding is most likely the source of her anemia.    Caren Macadam, MD  [facility], OBGYN

## 2016-01-02 DIAGNOSIS — D72829 Elevated white blood cell count, unspecified: Secondary | ICD-10-CM

## 2016-01-02 DIAGNOSIS — A499 Bacterial infection, unspecified: Secondary | ICD-10-CM

## 2016-01-02 DIAGNOSIS — D259 Leiomyoma of uterus, unspecified: Secondary | ICD-10-CM

## 2016-01-02 DIAGNOSIS — D62 Acute posthemorrhagic anemia: Secondary | ICD-10-CM

## 2016-01-02 DIAGNOSIS — D696 Thrombocytopenia, unspecified: Secondary | ICD-10-CM

## 2016-01-02 DIAGNOSIS — N920 Excessive and frequent menstruation with regular cycle: Secondary | ICD-10-CM

## 2016-01-02 DIAGNOSIS — N390 Urinary tract infection, site not specified: Secondary | ICD-10-CM

## 2016-01-02 DIAGNOSIS — A419 Sepsis, unspecified organism: Secondary | ICD-10-CM

## 2016-01-02 DIAGNOSIS — E876 Hypokalemia: Secondary | ICD-10-CM

## 2016-01-02 DIAGNOSIS — N839 Noninflammatory disorder of ovary, fallopian tube and broad ligament, unspecified: Secondary | ICD-10-CM

## 2016-01-02 LAB — CBC
HCT: 27 % — ABNORMAL LOW (ref 36.0–46.0)
HEMATOCRIT: 23 % — AB (ref 36.0–46.0)
HEMOGLOBIN: 7.3 g/dL — AB (ref 12.0–15.0)
Hemoglobin: 8.7 g/dL — ABNORMAL LOW (ref 12.0–15.0)
MCH: 23 pg — ABNORMAL LOW (ref 26.0–34.0)
MCH: 24.4 pg — AB (ref 26.0–34.0)
MCHC: 31.7 g/dL (ref 30.0–36.0)
MCHC: 32.2 g/dL (ref 30.0–36.0)
MCV: 72.3 fL — AB (ref 78.0–100.0)
MCV: 75.6 fL — AB (ref 78.0–100.0)
PLATELETS: 94 10*3/uL — AB (ref 150–400)
Platelets: 85 10*3/uL — ABNORMAL LOW (ref 150–400)
RBC: 3.18 MIL/uL — ABNORMAL LOW (ref 3.87–5.11)
RBC: 3.57 MIL/uL — AB (ref 3.87–5.11)
RDW: 26 % — ABNORMAL HIGH (ref 11.5–15.5)
RDW: 26.3 % — ABNORMAL HIGH (ref 11.5–15.5)
WBC: 12.5 10*3/uL — ABNORMAL HIGH (ref 4.0–10.5)
WBC: 11.3 10*3/uL — ABNORMAL HIGH (ref 4.0–10.5)

## 2016-01-02 LAB — TYPE AND SCREEN
ABO/RH(D): A POS
Antibody Screen: NEGATIVE
Unit division: 0
Unit division: 0
Unit division: 0
Unit division: 0

## 2016-01-02 LAB — GLUCOSE, CAPILLARY: Glucose-Capillary: 92 mg/dL (ref 65–99)

## 2016-01-02 LAB — BASIC METABOLIC PANEL WITH GFR
Anion gap: 9 (ref 5–15)
BUN: 8 mg/dL (ref 6–20)
CO2: 20 mmol/L — ABNORMAL LOW (ref 22–32)
Calcium: 7.9 mg/dL — ABNORMAL LOW (ref 8.9–10.3)
Chloride: 107 mmol/L (ref 101–111)
Creatinine, Ser: 0.64 mg/dL (ref 0.44–1.00)
GFR calc Af Amer: >60 mL/min
GFR calc non Af Amer: >60 mL/min
Glucose, Bld: 93 mg/dL (ref 65–99)
Potassium: 3.3 mmol/L — ABNORMAL LOW (ref 3.5–5.1)
Sodium: 136 mmol/L (ref 135–145)

## 2016-01-02 LAB — FOLATE: FOLATE: 15.9 ng/mL (ref >5.9)

## 2016-01-02 LAB — VITAMIN B12: Vitamin B-12: 1577 pg/mL — ABNORMAL HIGH (ref 180–914)

## 2016-01-02 LAB — PREPARE RBC (CROSSMATCH)

## 2016-01-02 MED ORDER — CALCIUM CARBONATE ANTACID 500 MG PO CHEW
1.0000 | CHEWABLE_TABLET | Freq: Four times a day (QID) | ORAL | Status: DC | PRN
  Administered 2016-01-02: 400 mg via ORAL
  Filled 2016-01-02: qty 2

## 2016-01-02 MED ORDER — POTASSIUM CHLORIDE CRYS ER 20 MEQ PO TBCR
40.0000 meq | EXTENDED_RELEASE_TABLET | Freq: Once | ORAL | Status: AC
  Administered 2016-01-02: 40 meq via ORAL
  Filled 2016-01-02: qty 2

## 2016-01-02 MED ORDER — SODIUM CHLORIDE 0.9 % IV SOLN
Freq: Once | INTRAVENOUS | Status: AC
  Administered 2016-01-02: 13:00:00 via INTRAVENOUS

## 2016-01-02 NOTE — Progress Notes (Signed)
BALJINDER RADEN   DOB:27-Nov-1976   [id number]   [id number]  Hematology service   Subjective: Pt was sitting in bed for lunch when I saw her, feels stronger today. She has received 4u RBC so far, will get one more unit today. No more vaginal bleeding. Fever resolved, blood pressure normal.   Objective:  Filed Vitals:   01/02/16 1331 01/02/16 1415  BP: 107/68 116/60  Pulse: 81 89  Temp: 98.3 F (36.8 C) 97.9 F (36.6 C)  Resp: 22 20    Body mass index is 29.02 kg/(m^2).  Intake/Output Summary (Last 24 hours) at 01/02/16 1444 Last data filed at 01/02/16 1331  Gross per 24 hour  Intake   1845 ml  Output    650 ml  Net   1195 ml     Sclerae unicteric  Oropharynx clear  No peripheral adenopathy  Lungs clear -- no rales or rhonchi  Heart regular rate and rhythm  Abdomen benign  MSK no focal spinal tenderness, no peripheral edema  Neuro nonfocal    CBG (last 3)   Recent Labs  12/31/15 1405 01/02/16 0732  GLUCAP 134* 92     Labs:  Lab Results  Component Value Date   WBC 12.5* 01/02/2016   HGB 7.3* 01/02/2016   HCT 23.0* 01/02/2016   MCV 72.3* 01/02/2016   PLT 85* 01/02/2016   NEUTROABS 12.5* 12/31/2015    @LASTCHEMISTRY @  Urine Studies No results for input(s): UHGB, CRYS in the last 72 hours.  Invalid input(s): UACOL, UAPR, USPG, UPH, UTP, UGL, UKET, UBIL, UNIT, UROB, ULEU, UEPI, UWBC, URBC, UBAC, CAST, UCOM, [place]  Basic Metabolic Panel:  Recent Labs Lab 12/31/15 1410 01/01/16 1005 01/02/16 0328  NA 135 136 136  K 2.8* 3.4* 3.3*  CL 102 105 107  CO2 18* 23 20*  GLUCOSE 143* 110* 93  BUN 13 11 8   CREATININE 1.11* 0.72 0.64  CALCIUM 9.4 8.1* 7.9*  MG  --  1.7  --    GFR Estimated Creatinine Clearance: 96.1 mL/min (by C-G formula based on Cr of 0.64). Liver Function Tests:  Recent Labs Lab 12/31/15 1410  AST 40  ALT 13*  ALKPHOS 43  BILITOT 0.6  PROT 7.8  ALBUMIN 3.6    Recent Labs Lab 12/31/15 1410  LIPASE 26   No  results for input(s): AMMONIA in the last 168 hours. Coagulation profile  Recent Labs Lab 12/31/15 2103  INR 1.52*    CBC:  Recent Labs Lab 12/31/15 1410 12/31/15 1527 12/31/15 2103 01/01/16 1005 01/01/16 1945 01/02/16 0817  WBC 13.5* 13.5*  --  15.4* 13.3* 12.5*  NEUTROABS 12.9* 12.5*  --   --   --   --   HGB 3.6* 3.7*  --  7.6* 7.2* 7.3*  HCT 13.9* 13.8*  --  24.3* 22.6* 23.0*  MCV 62.3* 61.6*  --  74.1* 73.4* 72.3*  PLT 118* 117* 103* 93* 86* 85*   Cardiac Enzymes: No results for input(s): CKTOTAL, CKMB, CKMBINDEX, TROPONINI in the last 168 hours. BNP: Invalid input(s): POCBNP CBG:  Recent Labs Lab 12/31/15 1405 01/02/16 0732  GLUCAP 134* 92   D-Dimer  Recent Labs  12/31/15 2103  DDIMER 1.58*   Hgb A1c No results for input(s): HGBA1C in the last 72 hours. Lipid Profile No results for input(s): CHOL, HDL, LDLCALC, TRIG, CHOLHDL, LDLDIRECT in the last 72 hours. Thyroid function studies  Recent Labs  12/31/15 1527  TSH 2.863   Anemia work up  [employer]  12/31/15 1524 12/31/15 1527 01/02/16 0328  VITAMINB12  --   --  1577*  FOLATE  --   --  15.9  FERRITIN 22  --   --   TIBC 521*  --   --   IRON 9*  --   --   RETICCTPCT  --  1.5  --    Microbiology Recent Results (from the past 240 hour(s))  Urine culture     Status: None (Preliminary result)   Collection Time: 12/31/15  4:02 PM  Result Value Ref Range Status   Specimen Description URINE, RANDOM  Final   Special Requests NONE  Final   Culture   Final    >=100,000 COLONIES/mL ESCHERICHIA COLI Performed at [facility]    Report Status PENDING  Incomplete  Blood culture (routine x 2)     Status: None (Preliminary result)   Collection Time: 12/31/15  9:03 PM  Result Value Ref Range Status   Specimen Description BLOOD RIGHT WRIST  Final   Special Requests BOTTLES DRAWN AEROBIC AND ANAEROBIC 5CC  Final   Culture  Setup Time   Final    GRAM POSITIVE COCCI IN CHAINS IN BOTH  AEROBIC AND ANAEROBIC BOTTLES CRITICAL RESULT CALLED TO, READ BACK BY AND VERIFIED WITH: J THICKPEN,RN AT 1327 01/01/16 BY L BENFIELD    Culture   Final    GROUP A STREP (S.PYOGENES) ISOLATED SUSCEPTIBILITIES TO FOLLOW HEALTH DEPARTMENT NOTIFIED Performed at [facility]    Report Status PENDING  Incomplete  MRSA PCR Screening     Status: None   Collection Time: 01/01/16  3:25 AM  Result Value Ref Range Status   MRSA by PCR NEGATIVE NEGATIVE Final    Comment:        The GeneXpert MRSA Assay (FDA approved for NASAL specimens only), is one component of a comprehensive MRSA colonization surveillance program. It is not intended to diagnose MRSA infection nor to guide or monitor treatment for MRSA infections.   Blood culture (routine x 2)     Status: None (Preliminary result)   Collection Time: 01/01/16 12:45 PM  Result Value Ref Range Status   Specimen Description BLOOD LEFT ARM  Final   Special Requests BOTTLES DRAWN AEROBIC AND ANAEROBIC 10CC  Final   Culture   Final    NO GROWTH < 24 HOURS Performed at [facility]    Report Status PENDING  Incomplete      Studies:  Dg Chest 2 View  12/31/2015  CLINICAL DATA:  LEFT side chest pain and abdomen, shortness of breath, fever and dry cough since yesterday EXAM: CHEST  2 VIEW COMPARISON:  02/12/2004 FINDINGS: Upper normal heart size. Normal mediastinal contours and pulmonary vascularity. Mild chronic central peribronchial thickening. No pulmonary infiltrate, pleural effusion or pneumothorax. Bones unremarkable. IMPRESSION: Mild chronic bronchitic changes without acute infiltrate. Electronically Signed   By: Lavonia Dana M.D.   On: 12/31/2015 15:32   US Transvaginal Non-ob  12/31/2015  CLINICAL DATA:  Nausea, vomiting. Question of fibroids and left ovarian cyst by CT today. Heavy bleeding. EXAM: TRANSABDOMINAL AND TRANSVAGINAL ULTRASOUND OF PELVIS TECHNIQUE: Both transabdominal and transvaginal ultrasound examinations  of the pelvis were performed. Transabdominal technique was performed for global imaging of the pelvis including uterus, ovaries, adnexal regions, and pelvic cul-de-sac. It was necessary to proceed with endovaginal exam following the transabdominal exam to visualize the endometrium. COMPARISON:  CT of the abdomen and pelvis 12/31/2015 FINDINGS: Uterus Measurements: Uterus is at least 14.5  x 9.8 x 9.5 cm. A circumscribed hypoechoic heterogeneous mass is identified in the central uterus, likely displacing the endometrial canal and it measures 6.7 x 5.5 x 5.3 cm. This is suspected to be a submucosal fibroid. Within the lower uterine segment there is a heterogeneous slightly hyperechoic mass in the central cervix measuring 5.0 x 3.0 x 5.2 cm. Small cystic lesion is identified in the posterior wall of the cervix measuring 2.7 x 1.0 x 2.1 cm. Endometrium Thickness: Obscured by fibroid, described above. Right ovary Measurements: 3.6 x 1.8 x 2.7 cm. Follicles are present; normal appearance. Left ovary Measurements: 8.9 x 6.2 x 5.7 cm. Normal ovarian tissue appears replaced by homogeneous hypoechoic and vascular tissue, suspicious for neoplasm. Other findings Trace free pelvic fluid. IMPRESSION: 1. Complex appearance of the uterus. Probable central fibroid displaces the endometrial canal. Central cervical mass could represent a fibroid, complex nabothian cyst, or malignancy. Cystic lesion also noted in the posterior aspect of the cervix. 2. Left ovarian mass is suspicious for neoplasm. Further evaluation is recommended. 3. Consider MRI for further characterization of these findings prior to surgical evaluation. Electronically Signed   By: Nolon Nations M.D.   On: 12/31/2015 19:19   US Pelvis Complete  12/31/2015  CLINICAL DATA:  Nausea, vomiting. Question of fibroids and left ovarian cyst by CT today. Heavy bleeding. EXAM: TRANSABDOMINAL AND TRANSVAGINAL ULTRASOUND OF PELVIS TECHNIQUE: Both transabdominal and  transvaginal ultrasound examinations of the pelvis were performed. Transabdominal technique was performed for global imaging of the pelvis including uterus, ovaries, adnexal regions, and pelvic cul-de-sac. It was necessary to proceed with endovaginal exam following the transabdominal exam to visualize the endometrium. COMPARISON:  CT of the abdomen and pelvis 12/31/2015 FINDINGS: Uterus Measurements: Uterus is at least 14.5 x 9.8 x 9.5 cm. A circumscribed hypoechoic heterogeneous mass is identified in the central uterus, likely displacing the endometrial canal and it measures 6.7 x 5.5 x 5.3 cm. This is suspected to be a submucosal fibroid. Within the lower uterine segment there is a heterogeneous slightly hyperechoic mass in the central cervix measuring 5.0 x 3.0 x 5.2 cm. Small cystic lesion is identified in the posterior wall of the cervix measuring 2.7 x 1.0 x 2.1 cm. Endometrium Thickness: Obscured by fibroid, described above. Right ovary Measurements: 3.6 x 1.8 x 2.7 cm. Follicles are present; normal appearance. Left ovary Measurements: 8.9 x 6.2 x 5.7 cm. Normal ovarian tissue appears replaced by homogeneous hypoechoic and vascular tissue, suspicious for neoplasm. Other findings Trace free pelvic fluid. IMPRESSION: 1. Complex appearance of the uterus. Probable central fibroid displaces the endometrial canal. Central cervical mass could represent a fibroid, complex nabothian cyst, or malignancy. Cystic lesion also noted in the posterior aspect of the cervix. 2. Left ovarian mass is suspicious for neoplasm. Further evaluation is recommended. 3. Consider MRI for further characterization of these findings prior to surgical evaluation. Electronically Signed   By: Nolon Nations M.D.   On: 12/31/2015 19:19   Ct Abdomen Pelvis W Contrast  12/31/2015  CLINICAL DATA:  Three day history of left upper quadrant and left back pain with nausea and vomiting. EXAM: CT ABDOMEN AND PELVIS WITH CONTRAST TECHNIQUE:  Multidetector CT imaging of the abdomen and pelvis was performed using the standard protocol following bolus administration of intravenous contrast. CONTRAST:  154mL OMNIPAQUE IOHEXOL 300 MG/ML  SOLN COMPARISON:  None. FINDINGS: Lower chest:  Unremarkable. Hepatobiliary: No focal abnormality within the liver parenchyma. 8 mm calcified stone identified in the gallbladder. No intrahepatic  or extrahepatic biliary dilation. Pancreas: No focal mass lesion. No dilatation of the main duct. No intraparenchymal cyst. No peripancreatic edema. Spleen: No splenomegaly. No focal mass lesion. Adrenals/Urinary Tract: No adrenal nodule or mass. 6 mm hypodensity in the upper pole right kidney is too small to characterize but likely represents a cyst. Left kidney is unremarkable. No evidence for hydroureter. The urinary bladder appears normal for the degree of distention. Stomach/Bowel: Stomach is nondistended. No gastric wall thickening. No evidence of outlet obstruction. Duodenum is normally positioned as is the ligament of Treitz. No small bowel wall thickening. No small bowel dilatation. The terminal ileum is normal. The appendix is normal. Cecal tip is high in the right abdomen. No gross colonic mass. No colonic wall thickening. No substantial diverticular change. Vascular/Lymphatic: No abdominal aortic aneurysm. No abdominal atherosclerotic calcification. There is no gastrohepatic or hepatoduodenal ligament lymphadenopathy. No intraperitoneal or retroperitoneal lymphadenopy. No pelvic sidewall lymphadenopathy. The gonadal vasculature is prominent bilaterally with perivascular edema/inflammation. Reproductive: Uterus appears markedly enlarged measuring 10.6 x 15.2 x 9.8 cm. Anterior intramural lesion measures 5.9 x 6.6 x 7.3 cm and displaces the endometrial cavity posteriorly. This is probably a degenerated fibroid. Left ovary is prominent with 4.1 x 5.8 cm cystic lesion within the parenchyma. The left gonadal vasculature is  engorged. Right ovary is posteriorly positioned in the cul-de-sac and right gonadal vasculature is also quite prominent. Other: Trace free fluid is seen in the cul-de-sac. Musculoskeletal: Bone windows reveal no worrisome lytic or sclerotic osseous lesions. IMPRESSION: 1. Enlarged uterus with complex anterior intramural cystic lesion, presumably representing age degenerated, intramural anterior uterine fibroid with substantial mass effect on the endometrial stripe. Pelvic ultrasound may prove helpful to further evaluate. 2. Prominent bilateral gonadal vasculature with perivascular edema/inflammation. Gonadal vein thrombosis could cause this appearance, but lack of intravenous contrast on today's study hinders assessment. 3. Trace free fluid in the cul-de-sac. This can be physiologic in a premenopausal female. 4. 5.8 cm left ovarian cyst without evidence for internal septation or with asymmetric wall thickening by CT imaging. Pelvic ultrasound may prove helpful to further evaluate. 5. Cholelithiasis without biliary dilatation. Electronically Signed   By: Misty Stanley M.D.   On: 12/31/2015 17:56    Assessment: 40 y.o. 40 yo female presented with N/V, abdominal pain, heavy menstrual period, fever and urinary frequency.   1. Severe microcytic hypo-productive anemia, secondary to iron deficiency 2. Mild thrombocytopenia, possible related to bleeding and mild DIC  4. Sepsis , resolving 3. Fever and UTI, resolving 4. Hypotension likely secondary to sepsis, resolved   Plan:  -Please consider iv Feraheme 510mg  iv once today or tomorrow  -If she goes home this weekend, I will see her in my office in 1-2 weeks, and I will give second dose for Feraheme on her follow-up visit -Ferrous sulfate 325 mg twice daily on discharge -Avoid NSAIDs -plt slightly worse, if stable over the next few days, OK to discharge from hematology standpoint. If continue getting worse, please repeat DIC panel and give me a call.     Truitt Merle, MD 01/02/2016  2:44 PM

## 2016-01-02 NOTE — Progress Notes (Signed)
Gave report to Angelica, Therapist, sports and all questions answered. Patient will notify family of room change. Pt transferred via wheelchair with all belongings including pocketbook, clothes, and cell phone.

## 2016-01-02 NOTE — ED Provider Notes (Signed)
CSN: [id number]     Arrival date & time 12/31/15  1345 History   First MD Initiated Contact with Patient 12/31/15 1454     Chief Complaint  Patient presents with  . Weakness  . Emesis     (Consider location/radiation/quality/duration/timing/severity/associated sxs/prior Treatment) HPI  40 -year-old female past medical history fibroids and hypothyroidism presents to the emergency department with weakness vomiting, dyspnea on exertion recently worse in the last few days. Also with a history of abnormal bleeding and lower abdominal pain as well. No no Associated symptoms no history of the same. Exacerbated by movement and relieved by rest.  Past Medical History  Diagnosis Date  . Thyroid disease     hypo   Past Surgical History  Procedure Laterality Date  . Neck surgery Left 2005  . Leep  2006   Family History  Problem Relation Age of Onset  . Anemia Mother   . Hypertension Father   . Hypertension Sister   . Breast cancer Cousin     Mother's cousin had breast cancer and 5 of her daughters also have cancer  . Breast cancer Cousin   . Breast cancer Cousin   . Uterine cancer Neg Hx   . Colon cancer Neg Hx   . Ovarian cancer Neg Hx    Social History  Substance Use Topics  . Smoking status: Never Smoker   . Smokeless tobacco: None  . Alcohol Use: No   OB History    Gravida Para Term Preterm AB TAB SAB Ectopic Multiple Living   2 2        2      Review of Systems  Constitutional: Positive for fatigue. Negative for fever.  HENT: Negative for congestion and facial swelling.   Eyes: Negative for discharge and redness.  Respiratory: Negative for cough and shortness of breath.   Cardiovascular: Negative for chest pain.  Gastrointestinal: Positive for nausea, vomiting and diarrhea. Negative for abdominal pain and abdominal distention.  Endocrine: Negative for polydipsia.  Genitourinary: Negative for dysuria.  Musculoskeletal: Negative for back pain.  Skin: Negative for  wound.  Neurological: Positive for weakness. Negative for syncope and headaches.  All other systems reviewed and are negative.     Allergies  Advil pm  Home Medications   Prior to Admission medications   Medication Sig Start Date End Date Taking? Authorizing Provider  ibuprofen (ADVIL) 200 MG tablet Take 400 mg by mouth every 6 (six) hours as needed for moderate pain.   Yes Historical Provider, MD  Multiple Vitamins-Minerals (WOMENS ONE DAILY PO) Take 1 tablet by mouth daily.   Yes Historical Provider, MD   BP 113/72 mmHg  Pulse 76  Temp(Src) 98.9 F (37.2 C) (Oral)  Resp 25  Ht 5\' 5"  (1.651 m)  Wt 174 lb 6.1 oz (79.1 kg)  BMI 29.02 kg/m2  SpO2 98%  LMP 12/28/2015 (Exact Date) Physical Exam  Constitutional: She is oriented to person, place, and time. She appears well-developed and well-nourished.  HENT:  Head: Normocephalic and atraumatic.  Cardiovascular: Regular rhythm.  Tachycardia present.   Pulmonary/Chest: Effort normal.  Abdominal: Soft. She exhibits no distension. There is tenderness.  Musculoskeletal: Normal range of motion. She exhibits no edema.  Neurological: She is alert and oriented to person, place, and time. No cranial nerve deficit. Coordination normal.  Nursing note and vitals reviewed.   ED Course  Procedures (including critical care time)  CRITICAL CARE Performed by: Merrily Pew  Total critical care time: 45 minutes Critical  care time was exclusive of separately billable procedures and treating other patients. Critical care was necessary to treat or prevent imminent or life-threatening deterioration. Critical care was time spent personally by me on the following activities: development of treatment plan with patient and/or surrogate as well as nursing, discussions with consultants, evaluation of patient's response to treatment, examination of patient, obtaining history from patient or surrogate, ordering and performing treatments and  interventions, ordering and review of laboratory studies, ordering and review of radiographic studies, pulse oximetry and re-evaluation of patient's condition.  Labs Review Labs Reviewed  COMPREHENSIVE METABOLIC PANEL - Abnormal; Notable for the following:    Potassium 2.8 (*)    CO2 18 (*)    Glucose, Bld 143 (*)    Creatinine, Ser 1.11 (*)    ALT 13 (*)    All other components within normal limits  CBC WITH DIFFERENTIAL/PLATELET - Abnormal; Notable for the following:    WBC 13.5 (*)    RBC 2.23 (*)    Hemoglobin 3.6 (*)    HCT 13.9 (*)    MCV 62.3 (*)    MCH 16.1 (*)    MCHC 25.9 (*)    RDW 20.1 (*)    Platelets 118 (*)    Neutro Abs 12.9 (*)    Lymphs Abs 0.3 (*)    All other components within normal limits  URINALYSIS, ROUTINE W REFLEX MICROSCOPIC (NOT AT [facility]) - Abnormal; Notable for the following:    APPearance TURBID (*)    Hgb urine dipstick MODERATE (*)    Protein, ur 100 (*)    Nitrite POSITIVE (*)    Leukocytes, UA MODERATE (*)    All other components within normal limits  CBC WITH DIFFERENTIAL/PLATELET - Abnormal; Notable for the following:    WBC 13.5 (*)    RBC 2.24 (*)    Hemoglobin 3.7 (*)    HCT 13.8 (*)    MCV 61.6 (*)    MCH 16.5 (*)    MCHC 26.8 (*)    RDW 20.1 (*)    Platelets 117 (*)    Neutro Abs 12.5 (*)    Lymphs Abs 0.3 (*)    All other components within normal limits  RETICULOCYTES - Abnormal; Notable for the following:    RBC. 2.24 (*)    All other components within normal limits  LACTIC ACID, PLASMA - Abnormal; Notable for the following:    Lactic Acid, Venous 4.3 (*)    All other components within normal limits  URINE MICROSCOPIC-ADD ON - Abnormal; Notable for the following:    Squamous Epithelial / LPF 6-30 (*)    Bacteria, UA MANY (*)    All other components within normal limits  DIC (DISSEMINATED INTRAVASCULAR COAGULATION) PANEL - Abnormal; Notable for the following:    Prothrombin Time 18.4 (*)    INR 1.52 (*)    Fibrinogen  538 (*)    D-Dimer, Quant 1.58 (*)    Platelets 103 (*)    All other components within normal limits  IRON AND TIBC - Abnormal; Notable for the following:    Iron 9 (*)    TIBC 521 (*)    Saturation Ratios 2 (*)    All other components within normal limits  CBC - Abnormal; Notable for the following:    WBC 15.4 (*)    RBC 3.28 (*)    Hemoglobin 7.6 (*)    HCT 24.3 (*)    MCV 74.1 (*)    MCH 23.2 (*)  RDW 25.9 (*)    Platelets 93 (*)    All other components within normal limits  CBC - Abnormal; Notable for the following:    WBC 13.3 (*)    RBC 3.08 (*)    Hemoglobin 7.2 (*)    HCT 22.6 (*)    MCV 73.4 (*)    MCH 23.4 (*)    RDW 25.7 (*)    Platelets 86 (*)    All other components within normal limits  BASIC METABOLIC PANEL - Abnormal; Notable for the following:    Potassium 3.4 (*)    Glucose, Bld 110 (*)    Calcium 8.1 (*)    All other components within normal limits  CBG MONITORING, ED - Abnormal; Notable for the following:    Glucose-Capillary 134 (*)    All other components within normal limits  POC OCCULT BLOOD, ED - Abnormal; Notable for the following:    Fecal Occult Bld POSITIVE (*)    All other components within normal limits  CULTURE, BLOOD (ROUTINE X 2)  MRSA PCR SCREENING  CULTURE, BLOOD (ROUTINE X 2)  URINE CULTURE  LIPASE, BLOOD  TSH  LACTATE DEHYDROGENASE  LACTIC ACID, PLASMA  PREGNANCY, URINE  FERRITIN  SAVE SMEAR  MAGNESIUM  LACTIC ACID, PLASMA  FOLATE  PATHOLOGIST SMEAR REVIEW  OCCULT BLOOD X 1 CARD TO LAB, STOOL  LACTIC ACID, PLASMA  PROCALCITONIN  VITAMIN 123456  BASIC METABOLIC PANEL  CBC  CA 0000000  CBC  TYPE AND SCREEN  PREPARE RBC (CROSSMATCH)  ABO/RH  CYTOLOGY - PAP    Imaging Review Dg Chest 2 View  12/31/2015  CLINICAL DATA:  LEFT side chest pain and abdomen, shortness of breath, fever and dry cough since yesterday EXAM: CHEST  2 VIEW COMPARISON:  02/12/2004 FINDINGS: Upper normal heart size. Normal mediastinal contours and  pulmonary vascularity. Mild chronic central peribronchial thickening. No pulmonary infiltrate, pleural effusion or pneumothorax. Bones unremarkable. IMPRESSION: Mild chronic bronchitic changes without acute infiltrate. Electronically Signed   By: Lavonia Dana M.D.   On: 12/31/2015 15:32   US Transvaginal Non-ob  12/31/2015  CLINICAL DATA:  Nausea, vomiting. Question of fibroids and left ovarian cyst by CT today. Heavy bleeding. EXAM: TRANSABDOMINAL AND TRANSVAGINAL ULTRASOUND OF PELVIS TECHNIQUE: Both transabdominal and transvaginal ultrasound examinations of the pelvis were performed. Transabdominal technique was performed for global imaging of the pelvis including uterus, ovaries, adnexal regions, and pelvic cul-de-sac. It was necessary to proceed with endovaginal exam following the transabdominal exam to visualize the endometrium. COMPARISON:  CT of the abdomen and pelvis 12/31/2015 FINDINGS: Uterus Measurements: Uterus is at least 14.5 x 9.8 x 9.5 cm. A circumscribed hypoechoic heterogeneous mass is identified in the central uterus, likely displacing the endometrial canal and it measures 6.7 x 5.5 x 5.3 cm. This is suspected to be a submucosal fibroid. Within the lower uterine segment there is a heterogeneous slightly hyperechoic mass in the central cervix measuring 5.0 x 3.0 x 5.2 cm. Small cystic lesion is identified in the posterior wall of the cervix measuring 2.7 x 1.0 x 2.1 cm. Endometrium Thickness: Obscured by fibroid, described above. Right ovary Measurements: 3.6 x 1.8 x 2.7 cm. Follicles are present; normal appearance. Left ovary Measurements: 8.9 x 6.2 x 5.7 cm. Normal ovarian tissue appears replaced by homogeneous hypoechoic and vascular tissue, suspicious for neoplasm. Other findings Trace free pelvic fluid. IMPRESSION: 1. Complex appearance of the uterus. Probable central fibroid displaces the endometrial canal. Central cervical mass could represent a fibroid,  complex nabothian cyst, or  malignancy. Cystic lesion also noted in the posterior aspect of the cervix. 2. Left ovarian mass is suspicious for neoplasm. Further evaluation is recommended. 3. Consider MRI for further characterization of these findings prior to surgical evaluation. Electronically Signed   By: Nolon Nations M.D.   On: 12/31/2015 19:19   US Pelvis Complete  12/31/2015  CLINICAL DATA:  Nausea, vomiting. Question of fibroids and left ovarian cyst by CT today. Heavy bleeding. EXAM: TRANSABDOMINAL AND TRANSVAGINAL ULTRASOUND OF PELVIS TECHNIQUE: Both transabdominal and transvaginal ultrasound examinations of the pelvis were performed. Transabdominal technique was performed for global imaging of the pelvis including uterus, ovaries, adnexal regions, and pelvic cul-de-sac. It was necessary to proceed with endovaginal exam following the transabdominal exam to visualize the endometrium. COMPARISON:  CT of the abdomen and pelvis 12/31/2015 FINDINGS: Uterus Measurements: Uterus is at least 14.5 x 9.8 x 9.5 cm. A circumscribed hypoechoic heterogeneous mass is identified in the central uterus, likely displacing the endometrial canal and it measures 6.7 x 5.5 x 5.3 cm. This is suspected to be a submucosal fibroid. Within the lower uterine segment there is a heterogeneous slightly hyperechoic mass in the central cervix measuring 5.0 x 3.0 x 5.2 cm. Small cystic lesion is identified in the posterior wall of the cervix measuring 2.7 x 1.0 x 2.1 cm. Endometrium Thickness: Obscured by fibroid, described above. Right ovary Measurements: 3.6 x 1.8 x 2.7 cm. Follicles are present; normal appearance. Left ovary Measurements: 8.9 x 6.2 x 5.7 cm. Normal ovarian tissue appears replaced by homogeneous hypoechoic and vascular tissue, suspicious for neoplasm. Other findings Trace free pelvic fluid. IMPRESSION: 1. Complex appearance of the uterus. Probable central fibroid displaces the endometrial canal. Central cervical mass could represent a fibroid,  complex nabothian cyst, or malignancy. Cystic lesion also noted in the posterior aspect of the cervix. 2. Left ovarian mass is suspicious for neoplasm. Further evaluation is recommended. 3. Consider MRI for further characterization of these findings prior to surgical evaluation. Electronically Signed   By: Nolon Nations M.D.   On: 12/31/2015 19:19   Ct Abdomen Pelvis W Contrast  12/31/2015  CLINICAL DATA:  Three day history of left upper quadrant and left back pain with nausea and vomiting. EXAM: CT ABDOMEN AND PELVIS WITH CONTRAST TECHNIQUE: Multidetector CT imaging of the abdomen and pelvis was performed using the standard protocol following bolus administration of intravenous contrast. CONTRAST:  126mL OMNIPAQUE IOHEXOL 300 MG/ML  SOLN COMPARISON:  None. FINDINGS: Lower chest:  Unremarkable. Hepatobiliary: No focal abnormality within the liver parenchyma. 8 mm calcified stone identified in the gallbladder. No intrahepatic or extrahepatic biliary dilation. Pancreas: No focal mass lesion. No dilatation of the main duct. No intraparenchymal cyst. No peripancreatic edema. Spleen: No splenomegaly. No focal mass lesion. Adrenals/Urinary Tract: No adrenal nodule or mass. 6 mm hypodensity in the upper pole right kidney is too small to characterize but likely represents a cyst. Left kidney is unremarkable. No evidence for hydroureter. The urinary bladder appears normal for the degree of distention. Stomach/Bowel: Stomach is nondistended. No gastric wall thickening. No evidence of outlet obstruction. Duodenum is normally positioned as is the ligament of Treitz. No small bowel wall thickening. No small bowel dilatation. The terminal ileum is normal. The appendix is normal. Cecal tip is high in the right abdomen. No gross colonic mass. No colonic wall thickening. No substantial diverticular change. Vascular/Lymphatic: No abdominal aortic aneurysm. No abdominal atherosclerotic calcification. There is no gastrohepatic or  hepatoduodenal ligament lymphadenopathy.  No intraperitoneal or retroperitoneal lymphadenopy. No pelvic sidewall lymphadenopathy. The gonadal vasculature is prominent bilaterally with perivascular edema/inflammation. Reproductive: Uterus appears markedly enlarged measuring 10.6 x 15.2 x 9.8 cm. Anterior intramural lesion measures 5.9 x 6.6 x 7.3 cm and displaces the endometrial cavity posteriorly. This is probably a degenerated fibroid. Left ovary is prominent with 4.1 x 5.8 cm cystic lesion within the parenchyma. The left gonadal vasculature is engorged. Right ovary is posteriorly positioned in the cul-de-sac and right gonadal vasculature is also quite prominent. Other: Trace free fluid is seen in the cul-de-sac. Musculoskeletal: Bone windows reveal no worrisome lytic or sclerotic osseous lesions. IMPRESSION: 1. Enlarged uterus with complex anterior intramural cystic lesion, presumably representing age degenerated, intramural anterior uterine fibroid with substantial mass effect on the endometrial stripe. Pelvic ultrasound may prove helpful to further evaluate. 2. Prominent bilateral gonadal vasculature with perivascular edema/inflammation. Gonadal vein thrombosis could cause this appearance, but lack of intravenous contrast on today's study hinders assessment. 3. Trace free fluid in the cul-de-sac. This can be physiologic in a premenopausal female. 4. 5.8 cm left ovarian cyst without evidence for internal septation or with asymmetric wall thickening by CT imaging. Pelvic ultrasound may prove helpful to further evaluate. 5. Cholelithiasis without biliary dilatation. Electronically Signed   By: Misty Stanley M.D.   On: 12/31/2015 17:56   I have personally reviewed and evaluated these images and lab results as part of my medical decision-making.   EKG Interpretation   Date/Time:  Wednesday December 31 2015 15:12:09 EST Ventricular Rate:  123 PR Interval:  119 QRS Duration: 88 QT Interval:  297 QTC  Calculation: 425 R Axis:   81 Text Interpretation:  Sinus tachycardia Repol abnrm suggests ischemia,  diffuse leads No previous ECGs available Confirmed by [facility] MD, Corene Cornea  [phone]) on 12/31/2015 3:51:41 PM      MDM   Final diagnoses:  Pain    Patient here with weakness and emesis, tachycardia tachypnea and abdominal pain. Felt a firm mass in her abdomen so CT scan done to evaluate for possible cancer versus obstruction. Found to have a hemoglobin of 3.6 which was repeated and was 3.7 also with thrombocytopenia) 116. Patient became febrile while in the emergency department making neoplasm a concern for sepsis so antibiotics broad spectrum were started. 2 large-bore IVs were obtained. Patient was consented and transfusion started. Labs were added on to evaluate for her source of bleeding and were relatively unremarkable. She had vaginal bleeding discussed case with the gynecology who recommended starting Megace and he would see her in the morning. They were willing to accept the patient in transfer however secondary to her relative instability and fever, vomiting wasn't sure that there wasn't something else going on so I discussed case with oncology who asked that on iron panel and DIC panel. Patient had a couple units of blood already and cannot get her studies at that time. They also will see the patient in the morning, I discussed case with medicine who admitted the patient to stepdown for further management.    Merrily Pew, MD 01/02/16 [phone]

## 2016-01-02 NOTE — Plan of Care (Signed)
Problem: Activity: Goal: Risk for activity intolerance will decrease Outcome: Progressing Patient cleared by OT today.

## 2016-01-02 NOTE — Progress Notes (Signed)
Patient ID: Alicia Carson, female   DOB: Apr 30, 1976, 40 y.o.   MRN: [id number] [facility] PROGRESS NOTE  Alicia Carson UMP:[id number] DOB: [birth year] DOA: 12/31/2015 PCP: No primary care provider on file. will have to follow up with GYN on discharge   Brief narrative:    40 y.o. female with past medical history of thyroid disease, heavy menstrual period who presented to [facility] 12/31/2015 with nausea, vomiting, abdominal pain, heavy menstrual period, increased urinary frequency and fever  For past 3 days prior to this admission. Abdominal pain was 5 out of 10 in intensity, nonradiating, mostly located in left abdomen. No associated diarrhea. No reports of blood in the stool or urine. No hematemesis.  In ED, patient was hemodynamically stable. Tmax was 102.9 F. lactic acid was 4.3. Her hemoglobin was as low as 3.6, platelets 118, white blood cell count 13.5, LDH 145. Potassium level was 2.8, creatinine 1.11. Fecal occult blood test was positive but unclear whether the source is actually vaginal rather than a rectal.  Hospital course remains complicated with ongoing anemia and need for blood transfusion. She is however hemodynamically stable for transfer to telemetry floor today.  Assessment/Plan:    Principal problem: Acute blood loss anemia secondary to uterine fibroids / menorrhagia  - As noted above, unclear whether fecal occult blood test positive result is vaginal versus rectal. - Considering that imaging studies point towards uterine fibroid this seems to be a likely source of bleeding - Patient has been seen by GYN and hematology in consultation and we appreciated their recommendations - So far she has received 4 units of PRBC transfusion - Hemoglobin still 7.3, slightly better than yesterday - We will transfuse 1 unit of blood today - Patient is hemodynamically stable for transfer to telemetry floor.  Active problems: Sepsis secondary to UTI / gram-positive  bacteremia / Leukocytosis - Sepsis criteria met with hypotension on admission, fever, leukocytosis and source of infection presumed to be urinary tract infection. Chest x-ray on the admission showed bronchitic changes. - Now we also note that blood cultures collected on the admission are growing gram-positive cocci in chains, she probably is growing Streptococcus but will see what final report shows  - Patient is on vancomycin and Zosyn and will continue those antibiotics for now   Left ovarian Mass, cyst, complex appearance of the uterus - I think it's reasonable to repeat MRI for further evaluation as suggested by imaging studies. We will do so tomorrow since today she will receive another unit of blood so to make sure she is more stable so we can maybe better evaluate was going on  Hypokalemia - Secondary to GI losses - Supplemented  Thrombocytopenia - Likely related to the acute blood loss anemia - Platelet count is 85 and will continue to monitor  DVT Prophylaxis  - SCD's bilaterally    Code Status: Full.  Family Communication:  plan of care discussed with the patient Disposition Plan: transfer to telemetry floor today   IV access:  Peripheral IV  Procedures and diagnostic studies:    Dg Chest 2 View 12/31/2015   Mild chronic bronchitic changes without acute infiltrate. Electronically Signed   By: Lavonia Dana M.D.   On: 12/31/2015 15:32   US Transvaginal Non-ob 12/31/2015  1. Complex appearance of the uterus. Probable central fibroid displaces the endometrial canal. Central cervical mass could represent a fibroid, complex nabothian cyst, or malignancy. Cystic lesion also noted in the posterior aspect of the cervix. 2.  Left ovarian mass is suspicious for neoplasm. Further evaluation is recommended. 3. Consider MRI for further characterization of these findings prior to surgical evaluation.  US Pelvis Complete 12/31/2015  1. Complex appearance of the uterus. Probable central fibroid  displaces the endometrial canal. Central cervical mass could represent a fibroid, complex nabothian cyst, or malignancy. Cystic lesion also noted in the posterior aspect of the cervix. 2. Left ovarian mass is suspicious for neoplasm. Further evaluation is recommended. 3. Consider MRI for further characterization of these findings prior to surgical evaluation. Electronically Signed   By: Nolon Nations M.D.   On: 12/31/2015 19:19   Ct Abdomen Pelvis W Contrast 12/31/2015 1. Enlarged uterus with complex anterior intramural cystic lesion, presumably representing age degenerated, intramural anterior uterine fibroid with substantial mass effect on the endometrial stripe. Pelvic ultrasound may prove helpful to further evaluate. 2. Prominent bilateral gonadal vasculature with perivascular edema/inflammation. Gonadal vein thrombosis could cause this appearance, but lack of intravenous contrast on today's study hinders assessment. 3. Trace free fluid in the cul-de-sac. This can be physiologic in a premenopausal female. 4. 5.8 cm left ovarian cyst without evidence for internal septation or with asymmetric wall thickening by CT imaging. Pelvic ultrasound may prove helpful to further evaluate. 5. Cholelithiasis without biliary dilatation.    Medical Consultants:  GYN Hematology  Other Consultants:  Physical therapy  IAnti-Infectives:   Vancomycin 1-11 --> Zosyn 1-11 -->   Leisa Lenz, MD  [facility] Pager [phone]  Time spent in minutes: 25 minutes  If 7PM-7AM, please contact night-coverage [email or web address] Password [email or web address] 01/02/2016, 8:29 AM   LOS: 2 days    HPI/Subjective: No acute overnight events. Patient reports she feels better.   Objective: Filed Vitals:   01/02/16 0500 01/02/16 0600 01/02/16 0700 01/02/16 0807  BP: 101/65 99/62 111/75   Pulse: 75 71 70   Temp:    98.5 F (36.9 C)  TempSrc:    Oral  Resp: _0 Height:      Weight:      SpO2: 97% 98% 99%      Intake/Output Summary (Last 24 hours) at 01/02/16 0829 Last data filed at 01/02/16 0600  Gross per 24 hour  Intake   2363 ml  Output    650 ml  Net   1713 ml    Exam:   General:  Pt is alert, follows commands appropriately, not in acute distress  Cardiovascular: Regular rate and rhythm, S1/S2 appreciated   Respiratory: Clear to auscultation bilaterally, no wheezing, no crackles, no rhonchi  Abdomen: Soft, non tender, non distended, bowel sounds present  Extremities: No edema, pulses DP and PT palpable bilaterally  Neuro: Grossly nonfocal  Data Reviewed: Basic Metabolic Panel:  Recent Labs Lab 12/31/15 1410 01/01/16 1005 01/02/16 0328  NA 135 136 136  K 2.8* 3.4* 3.3*  CL 102 105 107  CO2 18* 23 20*  GLUCOSE 143* 110* 93  BUN _1 CREATININE 1.11* 0.72 0.64  CALCIUM 9.4 8.1* 7.9*  MG  --  1.7  --    Liver Function Tests:  Recent Labs Lab 12/31/15 1410  AST 40  ALT 13*  ALKPHOS 43  BILITOT 0.6  PROT 7.8  ALBUMIN 3.6    Recent Labs Lab 12/31/15 1410  LIPASE 26   No results for input(s): AMMONIA in the last 168 hours. CBC:  Recent Labs Lab 12/31/15 1410 12/31/15 1527 12/31/15 2103 01/01/16 1005 01/01/16 1945  WBC 13.5* 13.5*  --  15.4* 13.3*  NEUTROABS 12.9* 12.5*  --   --   --   HGB 3.6* 3.7*  --  7.6* 7.2*  HCT 13.9* 13.8*  --  24.3* 22.6*  MCV 62.3* 61.6*  --  74.1* 73.4*  PLT 118* 117* 103* 93* 86*   Cardiac Enzymes: No results for input(s): CKTOTAL, CKMB, CKMBINDEX, TROPONINI in the last 168 hours. BNP: Invalid input(s): POCBNP CBG:  Recent Labs Lab 12/31/15 1405 01/02/16 0732  GLUCAP 134* 92    Blood culture (routine x 2)     Status: None (Preliminary result)   Collection Time: 12/31/15  9:03 PM  Result Value Ref Range Status   Specimen Description BLOOD RIGHT WRIST  Final   Special Requests BOTTLES DRAWN AEROBIC AND ANAEROBIC 5CC  Final   Culture  Setup Time   Final   Culture   Final    GRAM POSITIVE  COCCI Performed at [facility]    Report Status PENDING  Incomplete  MRSA PCR Screening     Status: None   Collection Time: 01/01/16  3:25 AM  Result Value Ref Range Status   MRSA by PCR NEGATIVE NEGATIVE Final     Scheduled Meds: . megestrol  40 mg Oral Daily  . multivitamin with minerals  1 tablet Oral Daily  . pantoprazole (PROTONIX) IV  40 mg Intravenous Q12H  . piperacillin-tazobactam (ZOSYN)  IV  3.375 g Intravenous Q8H  . sodium chloride  3 mL Intravenous Q12H  . vancomycin  1,000 mg Intravenous Q12H   Continuous Infusions: . sodium chloride 75 mL/hr at 01/02/16 0600

## 2016-01-03 DIAGNOSIS — A4151 Sepsis due to Escherichia coli [E. coli]: Secondary | ICD-10-CM

## 2016-01-03 DIAGNOSIS — B954 Other streptococcus as the cause of diseases classified elsewhere: Secondary | ICD-10-CM

## 2016-01-03 DIAGNOSIS — A408 Other streptococcal sepsis: Secondary | ICD-10-CM

## 2016-01-03 DIAGNOSIS — R7881 Bacteremia: Secondary | ICD-10-CM

## 2016-01-03 LAB — URINE CULTURE: Culture: >=100000

## 2016-01-03 LAB — CBC
HCT: 27.8 % — ABNORMAL LOW (ref 36.0–46.0)
HEMATOCRIT: 26.7 % — AB (ref 36.0–46.0)
HEMOGLOBIN: 8.7 g/dL — AB (ref 12.0–15.0)
Hemoglobin: 8.7 g/dL — ABNORMAL LOW (ref 12.0–15.0)
MCH: 24.1 pg — AB (ref 26.0–34.0)
MCH: 23.4 pg — AB (ref 26.0–34.0)
MCHC: 32.6 g/dL (ref 30.0–36.0)
MCHC: 31.3 g/dL (ref 30.0–36.0)
MCV: 74 fL — AB (ref 78.0–100.0)
MCV: 74.7 fL — AB (ref 78.0–100.0)
PLATELETS: 130 10*3/uL — AB (ref 150–400)
Platelets: 100 10*3/uL — ABNORMAL LOW (ref 150–400)
RBC: 3.61 MIL/uL — ABNORMAL LOW (ref 3.87–5.11)
RBC: 3.72 MIL/uL — ABNORMAL LOW (ref 3.87–5.11)
RDW: 26.4 % — AB (ref 11.5–15.5)
RDW: 27 % — AB (ref 11.5–15.5)
WBC: 11.3 10*3/uL — ABNORMAL HIGH (ref 4.0–10.5)
WBC: 9.1 10*3/uL (ref 4.0–10.5)

## 2016-01-03 LAB — CULTURE, BLOOD (ROUTINE X 2)

## 2016-01-03 LAB — GLUCOSE, CAPILLARY: GLUCOSE-CAPILLARY: 95 mg/dL (ref 65–99)

## 2016-01-03 LAB — CA 125: CA 125: 16.5 U/mL (ref 0.0–38.1)

## 2016-01-03 NOTE — Progress Notes (Signed)
ANTIBIOTIC CONSULT NOTE   Pharmacy Consult for Zosyn Indication: UTI, Strep bacteremia  Allergies  Allergen Reactions  . Advil Pm [Ibuprofen-Diphenhydramine Cit] Hives   Patient Measurements: Height: 5\' 5"  (165.1 cm) Weight: 174 lb 6.1 oz (79.1 kg) IBW/kg (Calculated) : 57  Vital Signs: Temp: 99 F (37.2 C) (01/14 0505) Temp Source: Oral (01/14 0505) BP: 112/72 mmHg (01/14 0505) Pulse Rate: 75 (01/14 0505) Intake/Output from previous day: 01/13 0701 - 01/14 0700 In: [id number] [I.V.:975; Blood:450; IV Piggyback:300] Out: -  Intake/Output from this shift:    Labs:  Recent Labs  12/31/15 1410  01/01/16 1005  01/02/16 0328 01/02/16 0817 01/02/16 2147 01/03/16 0729  WBC 13.5*  < > 15.4*  < >  --  12.5* 11.3* 11.3*  HGB 3.6*  < > 7.6*  < >  --  7.3* 8.7* 8.7*  PLT 118*  < > 93*  < >  --  85* 94* 100*  CREATININE 1.11*  --  0.72  --  0.64  --   --   --   < > = values in this interval not displayed. Estimated Creatinine Clearance: 96.1 mL/min (by C-G formula based on Cr of 0.64). No results for input(s): VANCOTROUGH, VANCOPEAK, VANCORANDOM, GENTTROUGH, GENTPEAK, GENTRANDOM, TOBRATROUGH, TOBRAPEAK, TOBRARND, AMIKACINPEAK, AMIKACINTROU, AMIKACIN in the last 72 hours.   Microbiology: Recent Results (from the past 720 hour(s))  Urine culture     Status: None   Collection Time: 12/31/15  4:02 PM  Result Value Ref Range Status   Specimen Description URINE, RANDOM  Final   Special Requests NONE  Final   Culture   Final    >=100,000 COLONIES/mL ESCHERICHIA COLI Performed at [facility]    Report Status 01/03/2016 FINAL  Final   Organism ID, Bacteria ESCHERICHIA COLI  Final      Susceptibility   Escherichia coli - MIC*    AMPICILLIN <=2 SENSITIVE Sensitive     CEFAZOLIN <=4 SENSITIVE Sensitive     CEFTRIAXONE <=1 SENSITIVE Sensitive     CIPROFLOXACIN <=0.25 SENSITIVE Sensitive     GENTAMICIN <=1 SENSITIVE Sensitive     IMIPENEM <=0.25 SENSITIVE Sensitive      NITROFURANTOIN <=16 SENSITIVE Sensitive     TRIMETH/SULFA <=20 SENSITIVE Sensitive     AMPICILLIN/SULBACTAM <=2 SENSITIVE Sensitive     PIP/TAZO <=4 SENSITIVE Sensitive     * >=100,000 COLONIES/mL ESCHERICHIA COLI  Blood culture (routine x 2)     Status: None   Collection Time: 12/31/15  9:03 PM  Result Value Ref Range Status   Specimen Description BLOOD RIGHT WRIST  Final   Special Requests BOTTLES DRAWN AEROBIC AND ANAEROBIC 5CC  Final   Culture  Setup Time   Final    GRAM POSITIVE COCCI IN CHAINS IN BOTH AEROBIC AND ANAEROBIC BOTTLES CRITICAL RESULT CALLED TO, READ BACK BY AND VERIFIED WITH: J THICKPEN,RN AT 1327 01/01/16 BY L BENFIELD    Culture   Final    STREPTOCOCCUS PYOGENES HEALTH DEPARTMENT NOTIFIED Performed at [facility]    Report Status 01/03/2016 FINAL  Final   Organism ID, Bacteria STREPTOCOCCUS PYOGENES  Final      Susceptibility   Streptococcus pyogenes - MIC*    CLINDAMYCIN <=0.25 SENSITIVE Sensitive     AMPICILLIN <=0.25 SENSITIVE Sensitive     ERYTHROMYCIN <=0.12 SENSITIVE Sensitive     VANCOMYCIN 0.5 SENSITIVE Sensitive     CEFTRIAXONE <=0.12 SENSITIVE Sensitive     LEVOFLOXACIN 0.5 SENSITIVE Sensitive     *  STREPTOCOCCUS PYOGENES  MRSA PCR Screening     Status: None   Collection Time: 01/01/16  3:25 AM  Result Value Ref Range Status   MRSA by PCR NEGATIVE NEGATIVE Final    Comment:        The GeneXpert MRSA Assay (FDA approved for NASAL specimens only), is one component of a comprehensive MRSA colonization surveillance program. It is not intended to diagnose MRSA infection nor to guide or monitor treatment for MRSA infections.   Blood culture (routine x 2)     Status: None (Preliminary result)   Collection Time: 01/01/16 12:45 PM  Result Value Ref Range Status   Specimen Description BLOOD LEFT ARM  Final   Special Requests BOTTLES DRAWN AEROBIC AND ANAEROBIC 10CC  Final   Culture   Final    NO GROWTH < 24 HOURS Performed at [facility]    Report Status PENDING  Incomplete    Medical History: Past Medical History  Diagnosis Date  . Thyroid disease     hypo   Medications:  Scheduled:  . megestrol  40 mg Oral Daily  . multivitamin with minerals  1 tablet Oral Daily  . pantoprazole (PROTONIX) IV  40 mg Intravenous Q12H  . piperacillin-tazobactam (ZOSYN)  IV  3.375 g Intravenous Q8H  . sodium chloride  3 mL Intravenous Q12H   Anti-infectives    Start     Dose/Rate Route Frequency Ordered Stop   01/01/16 2200  vancomycin (VANCOCIN) IVPB 1000 mg/200 mL premix  Status:  Discontinued     1,000 mg 200 mL/hr over 60 Minutes Intravenous Every 12 hours 12/31/15 2110 12/31/15 2111   01/01/16 1000  vancomycin (VANCOCIN) IVPB 1000 mg/200 mL premix  Status:  Discontinued     1,000 mg 200 mL/hr over 60 Minutes Intravenous Every 12 hours 12/31/15 2111 01/03/16 1148   01/01/16 0600  piperacillin-tazobactam (ZOSYN) IVPB 3.375 g     3.375 g 12.5 mL/hr over 240 Minutes Intravenous Every 8 hours 12/31/15 2110     12/31/15 2030  vancomycin (VANCOCIN) 1,500 mg in sodium chloride 0.9 % 500 mL IVPB     1,500 mg 250 mL/hr over 120 Minutes Intravenous  Once 12/31/15 2008 01/01/16 0123   12/31/15 2015  piperacillin-tazobactam (ZOSYN) IVPB 3.375 g     3.375 g 100 mL/hr over 30 Minutes Intravenous  Once 12/31/15 2008 12/31/15 2220     Assessment: 74 yoF with c/o nausea/shakes x 2 days. Hgb 3.7, Plt 117, plan transfusion. Per ED MD, vaginal bleeding, use po Megace per OB/Gyn recommendation. Febrile with leukocytosis, Vanc and Zosyn in ED, continue per Pharmacy.  Microbiology Results: 1/11 BCx: 1/2 Strep Pyogenes - pan sensitive 1/11 UCx: > 100K E coli - pan sensitive, including Zosyn 1/12 MRSA PCR: Neg 1/12 BCx: ngtd 1/14 BCx: sent  Plan:   Vancomycin discontinued  No change to Zosyn 3.375gm q8hr- 4 hr infusion  Follow up repeat blood cultures  Minda Ditto PharmD Pager [phone] 01/03/2016, 12:47 PM

## 2016-01-03 NOTE — Progress Notes (Addendum)
Patient ID: Alicia Carson, female   DOB: 09-08-1976, 40 y.o.   MRN: [id number] [facility] PROGRESS NOTE  Alicia Carson WLN:[id number] DOB: [birth year] DOA: 12/31/2015 PCP: No primary care provider on file. will have to follow up with GYN on discharge   Brief narrative:    40 y.o. female with past medical history of thyroid disease, heavy menstrual period who presented to [facility] 12/31/2015 with nausea, vomiting, abdominal pain, heavy menstrual period, increased urinary frequency and fever  For past 3 days prior to this admission. Abdominal pain was 5 out of 10 in intensity, nonradiating, mostly located in left abdomen. No associated diarrhea. No reports of blood in the stool or urine. No hematemesis.  In ED, patient was hemodynamically stable. Tmax was 102.9 F. lactic acid was 4.3. Her hemoglobin was as low as 3.6, platelets 118, white blood cell count 13.5, LDH 145. Potassium level was 2.8, creatinine 1.11. Fecal occult blood test was positive but unclear whether the source was actually vaginal rather than a rectal.  Hospital course remains complicated with ongoing anemia and need for blood transfusion.  Patient was transferred to telemetry floor 01/02/2016.  Of note, blood cultures positive for strep by genetics and urine culture growing Escherichia coli.  Plan to do MRI today to further evaluate ovarian cyst and uterine fibroid.  Assessment/Plan:    Principal problem: Acute blood loss anemia secondary to uterine fibroids / menorrhagia  - Unclear whether fecal occult blood test positive result is vaginal versus rectal. - CT scan and ultrasound showed uterine fibroids which likely is the source of bleed. - We will obtain MRI for further evaluation - She was seen by GYN and hematology. Hematology most recently recommended Feraheme 1 dose to be given today and one dose was given 01/02/2016 - So far patient has received total of 5 units of PRBC transfusion - Finally  blood counts are stabilizing with hemoglobin of 8.7 this morning and platelets 100   Active problems: Sepsis secondary to E.Coli UTI and streptococcus pyogenes bacteremia / Leukocytosis - Sepsis criteria met with hypotension on admission, fever, leukocytosis and source of infection presumed to be urinary tract infection. - Now we know that urine culture is growing Escherichia coli and blood cultures obtained on the admission are growing strep pyogenes  - Bronchitic changes seen on admission chest x-ray. - Patient initially on vancomycin and Zosyn. Because of new findings as noted above we will stop vancomycin and continue Zosyn only - Repeat blood cultures today   Left ovarian Mass, cyst, complex appearance of the uterus - Plan for MRI today for further evaluation   Hypokalemia - Secondary to GI losses - Supplemented  Thrombocytopenia - Likely related to the acute blood loss anemia - Platelets are improving   DVT Prophylaxis  - SCD's bilaterally in hospital    Code Status: Full.  Family Communication:  plan of care discussed with the patient Disposition Plan: MRI today, anticipate discharge by 01/05/2016  IV access:  Peripheral IV  Procedures and diagnostic studies:    Dg Chest 2 View 12/31/2015   Mild chronic bronchitic changes without acute infiltrate. Electronically Signed   By: Lavonia Dana M.D.   On: 12/31/2015 15:32   US Transvaginal Non-ob 12/31/2015  1. Complex appearance of the uterus. Probable central fibroid displaces the endometrial canal. Central cervical mass could represent a fibroid, complex nabothian cyst, or malignancy. Cystic lesion also noted in the posterior aspect of the cervix. 2. Left ovarian mass is suspicious  for neoplasm. Further evaluation is recommended. 3. Consider MRI for further characterization of these findings prior to surgical evaluation.  US Pelvis Complete 12/31/2015  1. Complex appearance of the uterus. Probable central fibroid displaces the  endometrial canal. Central cervical mass could represent a fibroid, complex nabothian cyst, or malignancy. Cystic lesion also noted in the posterior aspect of the cervix. 2. Left ovarian mass is suspicious for neoplasm. Further evaluation is recommended. 3. Consider MRI for further characterization of these findings prior to surgical evaluation. Electronically Signed   By: Nolon Nations M.D.   On: 12/31/2015 19:19   Ct Abdomen Pelvis W Contrast 12/31/2015 1. Enlarged uterus with complex anterior intramural cystic lesion, presumably representing age degenerated, intramural anterior uterine fibroid with substantial mass effect on the endometrial stripe. Pelvic ultrasound may prove helpful to further evaluate. 2. Prominent bilateral gonadal vasculature with perivascular edema/inflammation. Gonadal vein thrombosis could cause this appearance, but lack of intravenous contrast on today's study hinders assessment. 3. Trace free fluid in the cul-de-sac. This can be physiologic in a premenopausal female. 4. 5.8 cm left ovarian cyst without evidence for internal septation or with asymmetric wall thickening by CT imaging. Pelvic ultrasound may prove helpful to further evaluate. 5. Cholelithiasis without biliary dilatation.    Medical Consultants:  GYN Hematology  Other Consultants:  Physical therapy  IAnti-Infectives:   Vancomycin 1-11 --> 01/03/2016 Zosyn 1-11 -->   Leisa Lenz, MD  [facility] Pager [phone]  Time spent in minutes: 25 minutes  If 7PM-7AM, please contact night-coverage [email or web address] Password [facility] 01/03/2016, 10:49 AM   LOS: 3 days    HPI/Subjective: No acute overnight events. Patient reports she feels better.   Objective: Filed Vitals:   01/02/16 1415 01/02/16 1555 01/02/16 2115 01/03/16 0505  BP: 116/60 105/71 99/65 112/72  Pulse: 89 72 77 75  Temp: 97.9 F (36.6 C) 98.6 F (37 C) 98.7 F (37.1 C) 99 F (37.2 C)  TempSrc: Oral Oral Oral Oral  Resp: 20 20  20 20   Height:      Weight:      SpO2: 100% 100% 100% 100%    Intake/Output Summary (Last 24 hours) at 01/03/16 1049 Last data filed at 01/02/16 1900  Gross per 24 hour  Intake   1725 ml  Output      0 ml  Net   1725 ml    Exam:   General:  Pt is alert, not in acute distress  Cardiovascular: Regular rate and rhythm, S1/S2 (+)  Respiratory: no wheezing, no crackles, no rhonchi  Abdomen: non tender, non distended, bowel sounds present  Extremities: No leg swelling, pulses palpable bilaterally  Neuro: Nonfocal  Data Reviewed: Basic Metabolic Panel:  Recent Labs Lab 12/31/15 1410 01/01/16 1005 01/02/16 0328  NA 135 136 136  K 2.8* 3.4* 3.3*  CL 102 105 107  CO2 18* 23 20*  GLUCOSE 143* 110* 93  BUN 13 11 8   CREATININE 1.11* 0.72 0.64  CALCIUM 9.4 8.1* 7.9*  MG  --  1.7  --    Liver Function Tests:  Recent Labs Lab 12/31/15 1410  AST 40  ALT 13*  ALKPHOS 43  BILITOT 0.6  PROT 7.8  ALBUMIN 3.6    Recent Labs Lab 12/31/15 1410  LIPASE 26   No results for input(s): AMMONIA in the last 168 hours. CBC:  Recent Labs Lab 12/31/15 1410 12/31/15 1527  01/01/16 1005 01/01/16 1945 01/02/16 0817 01/02/16 2147 01/03/16 0729  WBC 13.5* 13.5*  --  15.4* 13.3* 12.5* 11.3* 11.3*  NEUTROABS 12.9* 12.5*  --   --   --   --   --   --   HGB 3.6* 3.7*  --  7.6* 7.2* 7.3* 8.7* 8.7*  HCT 13.9* 13.8*  --  24.3* 22.6* 23.0* 27.0* 26.7*  MCV 62.3* 61.6*  --  74.1* 73.4* 72.3* 75.6* 74.0*  PLT 118* 117*  < > 93* 86* 85* 94* 100*  < > = values in this interval not displayed. Cardiac Enzymes: No results for input(s): CKTOTAL, CKMB, CKMBINDEX, TROPONINI in the last 168 hours. BNP: Invalid input(s): POCBNP CBG:  Recent Labs Lab 12/31/15 1405 01/02/16 0732 01/03/16 0747  GLUCAP 134* 92 95   Urine culture     Status: None (Preliminary result)   Collection Time: 12/31/15  4:02 PM  Result Value Ref Range Status   Specimen Description URINE, RANDOM  Final    Special Requests NONE  Final   Culture   Final    >=100,000 COLONIES/mL ESCHERICHIA COLI Performed at [facility]    Report Status PENDING  Incomplete  Blood culture (routine x 2)     Status: None   Collection Time: 12/31/15  9:03 PM  Result Value Ref Range Status   Specimen Description BLOOD RIGHT WRIST  Final   Special Requests BOTTLES DRAWN AEROBIC AND ANAEROBIC 5CC  Final   Culture  Setup Time   Final   Culture   Final    STREPTOCOCCUS PYOGENES HEALTH DEPARTMENT NOTIFIED Performed at [facility]    Report Status 01/03/2016 FINAL  Final   Organism ID, Bacteria STREPTOCOCCUS PYOGENES  Final      Susceptibility   Streptococcus pyogenes - MIC*    CLINDAMYCIN <=0.25 SENSITIVE Sensitive     AMPICILLIN <=0.25 SENSITIVE Sensitive     ERYTHROMYCIN <=0.12 SENSITIVE Sensitive     VANCOMYCIN 0.5 SENSITIVE Sensitive     CEFTRIAXONE <=0.12 SENSITIVE Sensitive     LEVOFLOXACIN 0.5 SENSITIVE Sensitive     * STREPTOCOCCUS PYOGENES  MRSA PCR Screening     Status: None   Collection Time: 01/01/16  3:25 AM  Result Value Ref Range Status   MRSA by PCR NEGATIVE NEGATIVE Final  Blood culture (routine x 2)     Status: None (Preliminary result)   Collection Time: 01/01/16 12:45 PM  Result Value Ref Range Status   Specimen Description BLOOD LEFT ARM  Final   Special Requests BOTTLES DRAWN AEROBIC AND ANAEROBIC 10CC  Final   Culture   Final    NO GROWTH < 24 HOURS Performed at [facility]    Report Status PENDING  Incomplete     Scheduled Meds: . megestrol  40 mg Oral Daily  . multivitamin with minerals  1 tablet Oral Daily  . pantoprazole (PROTONIX) IV  40 mg Intravenous Q12H  . piperacillin-tazobactam (ZOSYN)  IV  3.375 g Intravenous Q8H  . sodium chloride  3 mL Intravenous Q12H  . vancomycin  1,000 mg Intravenous Q12H   Continuous Infusions: . sodium chloride 75 mL/hr at 01/02/16 2215

## 2016-01-04 LAB — BASIC METABOLIC PANEL
ANION GAP: 8 (ref 5–15)
BUN: <5 mg/dL — ABNORMAL LOW (ref 6–20)
CALCIUM: 8.5 mg/dL — AB (ref 8.9–10.3)
CO2: 21 mmol/L — ABNORMAL LOW (ref 22–32)
CREATININE: 0.83 mg/dL (ref 0.44–1.00)
Chloride: 112 mmol/L — ABNORMAL HIGH (ref 101–111)
Glucose, Bld: 95 mg/dL (ref 65–99)
Potassium: 3.1 mmol/L — ABNORMAL LOW (ref 3.5–5.1)
SODIUM: 141 mmol/L (ref 135–145)

## 2016-01-04 LAB — CBC
HCT: 27.4 % — ABNORMAL LOW (ref 36.0–46.0)
Hemoglobin: 8.6 g/dL — ABNORMAL LOW (ref 12.0–15.0)
MCH: 24 pg — ABNORMAL LOW (ref 26.0–34.0)
MCHC: 31.4 g/dL (ref 30.0–36.0)
MCV: 76.3 fL — ABNORMAL LOW (ref 78.0–100.0)
PLATELETS: 124 10*3/uL — AB (ref 150–400)
RBC: 3.59 MIL/uL — ABNORMAL LOW (ref 3.87–5.11)
RDW: 27.6 % — AB (ref 11.5–15.5)
WBC: 8 10*3/uL (ref 4.0–10.5)

## 2016-01-04 LAB — GLUCOSE, CAPILLARY: GLUCOSE-CAPILLARY: 108 mg/dL — AB (ref 65–99)

## 2016-01-04 NOTE — Progress Notes (Signed)
Patient ID: TIYE HUWE, female   DOB: July 23, 1976, 40 y.o.   MRN: [id number] [facility] PROGRESS NOTE  LAI HENDRIKS BHA:[id number] DOB: 1976-11-10 DOA: 12/31/2015 PCP: No primary care provider on file. will have to follow up with GYN on discharge   Brief narrative:    40 y.o. female with past medical history of thyroid disease, heavy menstrual period who presented to [facility] long hospital 12/31/2015 with nausea, vomiting, abdominal pain, heavy menstrual period, increased urinary frequency and fever  For past 3 days prior to this admission. Abdominal pain was 5 out of 10 in intensity, nonradiating, mostly located in left abdomen. No associated diarrhea. No reports of blood in the stool or urine. No hematemesis.  In ED, patient was hemodynamically stable. Tmax was 102.9 F. lactic acid was 4.3. Her hemoglobin was as low as 3.6, platelets 118, white blood cell count 13.5, LDH 145. Potassium level was 2.8, creatinine 1.11. Fecal occult blood test was positive but unclear whether the source was actually vaginal rather than a rectal.  Hospital course remains complicated with ongoing anemia and need for blood transfusion.  Patient was transferred to telemetry floor 01/02/2016.  Of note, blood cultures positive for strep by genetics and urine culture growing Escherichia coli.  Plan to do MRI to further evaluate ovarian cyst and uterine fibroid.  Assessment/Plan:    Principal problem: Acute blood loss anemia secondary to uterine fibroids / menorrhagia  - Unclear whether fecal occult blood test positive result is vaginal versus rectal. - CT scan and ultrasound point towards the uterine fibroid which likely is the source of bleed. - Plan for MRI tomorrow for further evaluation - She was seen by GYN and hematology. Hematology most recently recommended Feraheme 1 dose given 01/03/2016 and 01/02/2016 - Patient has received total of 5 units of PRBC transfusion during this hospital stay  -  Hemoglobin is stable    Active problems: Sepsis secondary to E.Coli UTI and streptococcus pyogenes bacteremia / Leukocytosis - Sepsis criteria met with hypotension on admission, fever, leukocytosis and source of infection presumed to be urinary tract infection. - Urine culture is growing Escherichia coli and blood cultures obtained on the admission are growing strep pyogenes (only 1 of 2 blood cultures) - Repeat blood cultures 01/03/2016 are pending - Patient initially on vancomycin and Zosyn. Because of new findings we stopped vancomycin 1/14 and continued Zosyn only  Left ovarian Mass, cyst, complex appearance of the uterus - Plan for MRI for further evaluation tomorrow   Hypokalemia - Secondary to GI losses - Supplemented  Thrombocytopenia - Likely related to the acute blood loss anemia - Platelet count overall improved and stable   DVT Prophylaxis  - SCD's bilaterally due to risk of bleeding    Code Status: Full.  Family Communication:  plan of care discussed with the patient Disposition Plan: MRI 1/16 and if all ok then d/c 1/16  IV access:  Peripheral IV  Procedures and diagnostic studies:    Dg Chest 2 View 12/31/2015   Mild chronic bronchitic changes without acute infiltrate. Electronically Signed   By: Lavonia Dana M.D.   On: 12/31/2015 15:32   US Transvaginal Non-ob 12/31/2015  1. Complex appearance of the uterus. Probable central fibroid displaces the endometrial canal. Central cervical mass could represent a fibroid, complex nabothian cyst, or malignancy. Cystic lesion also noted in the posterior aspect of the cervix. 2. Left ovarian mass is suspicious for neoplasm. Further evaluation is recommended. 3. Consider MRI for further characterization  of these findings prior to surgical evaluation.  US Pelvis Complete 12/31/2015  1. Complex appearance of the uterus. Probable central fibroid displaces the endometrial canal. Central cervical mass could represent a fibroid, complex  nabothian cyst, or malignancy. Cystic lesion also noted in the posterior aspect of the cervix. 2. Left ovarian mass is suspicious for neoplasm. Further evaluation is recommended. 3. Consider MRI for further characterization of these findings prior to surgical evaluation. Electronically Signed   By: Nolon Nations M.D.   On: 12/31/2015 19:19   Ct Abdomen Pelvis W Contrast 12/31/2015 1. Enlarged uterus with complex anterior intramural cystic lesion, presumably representing age degenerated, intramural anterior uterine fibroid with substantial mass effect on the endometrial stripe. Pelvic ultrasound may prove helpful to further evaluate. 2. Prominent bilateral gonadal vasculature with perivascular edema/inflammation. Gonadal vein thrombosis could cause this appearance, but lack of intravenous contrast on today's study hinders assessment. 3. Trace free fluid in the cul-de-sac. This can be physiologic in a premenopausal female. 4. 5.8 cm left ovarian cyst without evidence for internal septation or with asymmetric wall thickening by CT imaging. Pelvic ultrasound may prove helpful to further evaluate. 5. Cholelithiasis without biliary dilatation.    Medical Consultants:  GYN Hematology  Other Consultants:  Physical therapy  IAnti-Infectives:   Vancomycin 1-11 --> 01/03/2016 Zosyn 1-11 -->   Leisa Lenz, MD  [facility] Pager [phone]  Time spent in minutes: 25 minutes  If 7PM-7AM, please contact night-coverage [email or web address] Password [facility] 01/04/2016, 11:37 AM   LOS: 4 days    HPI/Subjective: No acute overnight events. Patient reports feeling little tired but otherwise no nausea or vomiting. Pain is controlled.  Objective: Filed Vitals:   01/03/16 0505 01/03/16 1335 01/03/16 2052 01/04/16 0523  BP: 112/72 113/71 104/67 116/69  Pulse: 75 80 68 68  Temp: 99 F (37.2 C) 98.4 F (36.9 C) 98.6 F (37 C) 98.3 F (36.8 C)  TempSrc: Oral Oral Oral Oral  Resp: 20 18 18 18   Height:       Weight:      SpO2: 100% 100% 100% 98%    Intake/Output Summary (Last 24 hours) at 01/04/16 1137 Last data filed at 01/04/16 0839  Gross per 24 hour  Intake   2553 ml  Output      4 ml  Net   2549 ml    Exam:   General:  Pt is not in acute distress  Cardiovascular: RRR, S1/S2 appreciated   Respiratory: Bilateral air entry, no wheezing  Abdomen: Appreciate bowel sounds, nontender abdomen  Extremities: No edema, pulses palpable  Neuro: No focal deficit  Data Reviewed: Basic Metabolic Panel:  Recent Labs Lab 12/31/15 1410 01/01/16 1005 01/02/16 0328 01/04/16 0516  NA 135 136 136 141  K 2.8* 3.4* 3.3* 3.1*  CL 102 105 107 112*  CO2 18* 23 20* 21*  GLUCOSE 143* 110* 93 95  BUN 13 11 8  <5*  CREATININE 1.11* 0.72 0.64 0.83  CALCIUM 9.4 8.1* 7.9* 8.5*  MG  --  1.7  --   --    Liver Function Tests:  Recent Labs Lab 12/31/15 1410  AST 40  ALT 13*  ALKPHOS 43  BILITOT 0.6  PROT 7.8  ALBUMIN 3.6    Recent Labs Lab 12/31/15 1410  LIPASE 26   No results for input(s): AMMONIA in the last 168 hours. CBC:  Recent Labs Lab 12/31/15 1410 12/31/15 1527  01/02/16 0817 01/02/16 2147 01/03/16 0729 01/03/16 1955 01/04/16 0516  WBC 13.5* 13.5*  < >  12.5* 11.3* 11.3* 9.1 8.0  NEUTROABS 12.9* 12.5*  --   --   --   --   --   --   HGB 3.6* 3.7*  < > 7.3* 8.7* 8.7* 8.7* 8.6*  HCT 13.9* 13.8*  < > 23.0* 27.0* 26.7* 27.8* 27.4*  MCV 62.3* 61.6*  < > 72.3* 75.6* 74.0* 74.7* 76.3*  PLT 118* 117*  < > 85* 94* 100* 130* 124*  < > = values in this interval not displayed. Cardiac Enzymes: No results for input(s): CKTOTAL, CKMB, CKMBINDEX, TROPONINI in the last 168 hours. BNP: Invalid input(s): POCBNP CBG:  Recent Labs Lab 12/31/15 1405 01/02/16 0732 01/03/16 0747  GLUCAP 134* 92 95    Results for orders placed or performed during the hospital encounter of 12/31/15  Urine culture     Status: None   Collection Time: 12/31/15  4:02 PM  Result Value Ref  Range Status   Specimen Description URINE, RANDOM  Final   Special Requests NONE  Final   Culture   Final    >=100,000 COLONIES/mL ESCHERICHIA COLI Performed at [facility]    Report Status 01/03/2016 FINAL  Final   Organism ID, Bacteria ESCHERICHIA COLI  Final      Susceptibility   Escherichia coli - MIC*    AMPICILLIN <=2 SENSITIVE Sensitive     CEFAZOLIN <=4 SENSITIVE Sensitive     CEFTRIAXONE <=1 SENSITIVE Sensitive     CIPROFLOXACIN <=0.25 SENSITIVE Sensitive     GENTAMICIN <=1 SENSITIVE Sensitive     IMIPENEM <=0.25 SENSITIVE Sensitive     NITROFURANTOIN <=16 SENSITIVE Sensitive     TRIMETH/SULFA <=20 SENSITIVE Sensitive     AMPICILLIN/SULBACTAM <=2 SENSITIVE Sensitive     PIP/TAZO <=4 SENSITIVE Sensitive     * >=100,000 COLONIES/mL ESCHERICHIA COLI  Blood culture (routine x 2)     Status: None   Collection Time: 12/31/15  9:03 PM  Result Value Ref Range Status   Specimen Description BLOOD RIGHT WRIST  Final   Special Requests BOTTLES DRAWN AEROBIC AND ANAEROBIC 5CC  Final   Culture  Setup Time   Final    GRAM POSITIVE COCCI IN CHAINS IN BOTH AEROBIC AND ANAEROBIC BOTTLES CRITICAL RESULT CALLED TO, READ BACK BY AND VERIFIED WITH: J THICKPEN,RN AT 3299 01/01/16 BY L BENFIELD    Culture   Final    STREPTOCOCCUS PYOGENES HEALTH DEPARTMENT NOTIFIED Performed at [facility]    Report Status 01/03/2016 FINAL  Final   Organism ID, Bacteria STREPTOCOCCUS PYOGENES  Final      Susceptibility   Streptococcus pyogenes - MIC*    CLINDAMYCIN <=0.25 SENSITIVE Sensitive     AMPICILLIN <=0.25 SENSITIVE Sensitive     ERYTHROMYCIN <=0.12 SENSITIVE Sensitive     VANCOMYCIN 0.5 SENSITIVE Sensitive     CEFTRIAXONE <=0.12 SENSITIVE Sensitive     LEVOFLOXACIN 0.5 SENSITIVE Sensitive     * STREPTOCOCCUS PYOGENES  MRSA PCR Screening     Status: None   Collection Time: 01/01/16  3:25 AM  Result Value Ref Range Status   MRSA by PCR NEGATIVE NEGATIVE Final    Comment:         The GeneXpert MRSA Assay (FDA approved for NASAL specimens only), is one component of a comprehensive MRSA colonization surveillance program. It is not intended to diagnose MRSA infection nor to guide or monitor treatment for MRSA infections.   Blood culture (routine x 2)     Status: None (Preliminary result)  Collection Time: 01/01/16 12:45 PM  Result Value Ref Range Status   Specimen Description BLOOD LEFT ARM  Final   Special Requests BOTTLES DRAWN AEROBIC AND ANAEROBIC 10CC  Final   Culture   Final    NO GROWTH 2 DAYS Performed at [facility]    Report Status PENDING  Incomplete  Culture, blood (routine x 2)     Status: None (Preliminary result)   Collection Time: 01/03/16 12:00 PM  Result Value Ref Range Status   Specimen Description BLOOD LEFT ANTECUBITAL  Final   Special Requests   Final    BOTTLES DRAWN AEROBIC AND ANAEROBIC 10CC Performed at [facility]    Culture PENDING  Incomplete   Report Status PENDING  Incomplete  Culture, blood (routine x 2)     Status: None (Preliminary result)   Collection Time: 01/03/16 12:05 PM  Result Value Ref Range Status   Specimen Description BLOOD LEFT ARM  Final   Special Requests   Final    BOTTLES DRAWN AEROBIC AND ANAEROBIC 10CC Performed at [facility]    Culture PENDING  Incomplete   Report Status PENDING  Incomplete     Scheduled Meds: . megestrol  40 mg Oral Daily  . multivitamin with minerals  1 tablet Oral Daily  . pantoprazole (PROTONIX) IV  40 mg Intravenous Q12H  . piperacillin-tazobactam (ZOSYN)  IV  3.375 g Intravenous Q8H  . sodium chloride  3 mL Intravenous Q12H   Continuous Infusions: . sodium chloride 75 mL/hr at 01/04/16 0249

## 2016-01-04 NOTE — Progress Notes (Signed)
Resumed care of patient. Agree with previous assessment. Will continue to monitor. 

## 2016-01-05 ENCOUNTER — Other Ambulatory Visit: Payer: Self-pay | Admitting: Hematology

## 2016-01-05 ENCOUNTER — Ambulatory Visit (HOSPITAL_COMMUNITY): Payer: 59

## 2016-01-05 ENCOUNTER — Inpatient Hospital Stay (HOSPITAL_COMMUNITY): Payer: 59

## 2016-01-05 DIAGNOSIS — D500 Iron deficiency anemia secondary to blood loss (chronic): Secondary | ICD-10-CM

## 2016-01-05 LAB — CBC
HCT: 26.2 % — ABNORMAL LOW (ref 36.0–46.0)
Hemoglobin: 8.1 g/dL — ABNORMAL LOW (ref 12.0–15.0)
MCH: 23.8 pg — ABNORMAL LOW (ref 26.0–34.0)
MCHC: 30.9 g/dL (ref 30.0–36.0)
MCV: 76.8 fL — ABNORMAL LOW (ref 78.0–100.0)
Platelets: 135 10*3/uL — ABNORMAL LOW (ref 150–400)
RBC: 3.41 MIL/uL — ABNORMAL LOW (ref 3.87–5.11)
RDW: 28.9 % — AB (ref 11.5–15.5)
WBC: 6.1 10*3/uL (ref 4.0–10.5)

## 2016-01-05 LAB — BASIC METABOLIC PANEL
Anion gap: 10 (ref 5–15)
BUN: <5 mg/dL — ABNORMAL LOW (ref 6–20)
CALCIUM: 8.1 mg/dL — AB (ref 8.9–10.3)
CO2: 21 mmol/L — ABNORMAL LOW (ref 22–32)
CREATININE: 0.8 mg/dL (ref 0.44–1.00)
Chloride: 110 mmol/L (ref 101–111)
Glucose, Bld: 95 mg/dL (ref 65–99)
Potassium: 3 mmol/L — ABNORMAL LOW (ref 3.5–5.1)
SODIUM: 141 mmol/L (ref 135–145)

## 2016-01-05 LAB — TYPE AND SCREEN
ABO/RH(D): A POS
Antibody Screen: NEGATIVE
Unit division: 0

## 2016-01-05 LAB — GLUCOSE, CAPILLARY: GLUCOSE-CAPILLARY: 88 mg/dL (ref 65–99)

## 2016-01-05 MED ORDER — MEGESTROL ACETATE 40 MG PO TABS: 40.0000 mg | ORAL_TABLET | Freq: Every day | ORAL | Status: DC

## 2016-01-05 MED ORDER — PANTOPRAZOLE SODIUM 40 MG PO TBEC: 40.0000 mg | DELAYED_RELEASE_TABLET | Freq: Every day | ORAL | Status: DC

## 2016-01-05 MED ORDER — CEFPODOXIME PROXETIL 200 MG PO TABS
200.0000 mg | ORAL_TABLET | Freq: Two times a day (BID) | ORAL | Status: DC
Start: 2016-01-05 — End: 2016-01-22

## 2016-01-05 MED ORDER — POTASSIUM CHLORIDE CRYS ER 20 MEQ PO TBCR
40.0000 meq | EXTENDED_RELEASE_TABLET | Freq: Once | ORAL | Status: AC
  Administered 2016-01-05: 40 meq via ORAL
  Filled 2016-01-05: qty 2

## 2016-01-05 NOTE — Care Management Note (Signed)
Case Management Note  Patient Details  Name: Alicia Carson MRN: [id number] Date of Birth: 04-13-1976  Subjective/Objective:                    Action/Plan:d/c home no needs or orders.   Expected Discharge Date:   (unknown)               Expected Discharge Plan:  Home/Self Care  In-House Referral:  NA  Discharge planning Services  CM Consult  Post Acute Care Choice:  NA Choice offered to:  NA  DME Arranged:    DME Agency:     HH Arranged:    HH Agency:     Status of Service:  Completed, signed off  Medicare Important Message Given:    Date Medicare IM Given:    Medicare IM give by:    Date Additional Medicare IM Given:    Additional Medicare Important Message give by:     If discussed at [place] of Stay Meetings, dates discussed:    Additional Comments:  Dessa Phi, RN 01/05/2016, 3:28 PM

## 2016-01-05 NOTE — Discharge Summary (Signed)
Physician Discharge Summary  Alicia Carson DZH:[id number] DOB: 08/06/1976 DOA: 12/31/2015  PCP: F/U with GYN on discharge   Admit date: 12/31/2015 Discharge date: 01/05/2016  Recommendations for Outpatient Follow-up:  Continue vantin on discharge for 2 weeks (for strep bacteremia and E.Coli UTI) Repeat blood culture is negative Please have PCP referral for MRI on outpatient basis to evaluate ovarian mass / cyst  Discharge Diagnoses:  Principal Problem:   Symptomatic anemia Active Problems:   Heavy menstrual period   UTI (lower urinary tract infection)   Sepsis (HCC)   Fibroid   Fever   Nausea & vomiting   Abdominal pain   Hypokalemia    Discharge Condition: stable   Diet recommendation: as tolerated   History of present illness:  40 y.o. female with past medical history of thyroid disease, heavy menstrual period who presented to [facility] 12/31/2015 with nausea, vomiting, abdominal pain, heavy menstrual period, increased urinary frequency and fever For past 3 days prior to this admission. Abdominal pain was 5 out of 10 in intensity, nonradiating, mostly located in left abdomen. No associated diarrhea. No reports of blood in the stool or urine. No hematemesis.  In ED, patient was hemodynamically stable. Tmax was 102.9 F. lactic acid was 4.3. Her hemoglobin was as low as 3.6, platelets 118, white blood cell count 13.5, LDH 145. Potassium level was 2.8, creatinine 1.11. Fecal occult blood test was positive but unclear whether the source was actually vaginal rather than a rectal.  Hospital course remains complicated with ongoing anemia and need for blood transfusion.  Patient was transferred to telemetry floor 01/02/2016.  Of note, blood cultures positive for strep by genetics and urine culture growing Escherichia coli.  Hospital Course:   Assessment/Plan:    Principal problem: Acute blood loss anemia secondary to uterine fibroids / menorrhagia  - Unclear  whether fecal occult blood test positive result is vaginal versus rectal. - CT scan and ultrasound point towards the uterine fibroid which likely is the source of bleed. - Planned for MRI tomorrow for further evaluation but pt claustrophobic and could not do it and she declined antianxiety meds, needs oupt follow up and she understands - She was seen by GYN and hematology. Hematology recommended Feraheme 1 dose given 01/03/2016 and 01/02/2016 - Patient has received total of 5 units of PRBC transfusion during this hospital stay  - Hemoglobin is stable    Active problems: Sepsis secondary to E.Coli UTI and streptococcus pyogenes bacteremia / Leukocytosis - Sepsis criteria met with hypotension on admission, fever, leukocytosis and source of infection presumed to be urinary tract infection. - Urine culture is growing Escherichia coli and blood cultures obtained on the admission are growing strep pyogenes (only 1 of 2 blood cultures) - Repeat blood cultures 01/03/2016 are pending - Patient initially on vancomycin and Zosyn. Because of new findings we stopped vancomycin 1/14 and we continued Zosyn only - On discharge she will continue vantin for 2 weeks  Left ovarian Mass, cyst, complex appearance of the uterus - MRI on outpt basis   Hypokalemia - Secondary to GI losses - Supplemented  Thrombocytopenia - Likely related to the acute blood loss anemia - Platelet count overall improved and stable   DVT Prophylaxis  - SCD's in hospital    Code Status: Full.  Family Communication: plan of care discussed with the patient   IV access:  Peripheral IV  Procedures and diagnostic studies:   Dg Chest 2 View 12/31/2015 Mild chronic bronchitic changes without  acute infiltrate. Electronically Signed By: Lavonia Dana M.D. On: 12/31/2015 15:32   US Transvaginal Non-ob 12/31/2015 1. Complex appearance of the uterus. Probable central fibroid displaces the endometrial canal. Central  cervical mass could represent a fibroid, complex nabothian cyst, or malignancy. Cystic lesion also noted in the posterior aspect of the cervix. 2. Left ovarian mass is suspicious for neoplasm. Further evaluation is recommended. 3. Consider MRI for further characterization of these findings prior to surgical evaluation.  US Pelvis Complete 12/31/2015 1. Complex appearance of the uterus. Probable central fibroid displaces the endometrial canal. Central cervical mass could represent a fibroid, complex nabothian cyst, or malignancy. Cystic lesion also noted in the posterior aspect of the cervix. 2. Left ovarian mass is suspicious for neoplasm. Further evaluation is recommended. 3. Consider MRI for further characterization of these findings prior to surgical evaluation. Electronically Signed By: Nolon Nations M.D. On: 12/31/2015 19:19   Ct Abdomen Pelvis W Contrast 12/31/2015 1. Enlarged uterus with complex anterior intramural cystic lesion, presumably representing age degenerated, intramural anterior uterine fibroid with substantial mass effect on the endometrial stripe. Pelvic ultrasound may prove helpful to further evaluate. 2. Prominent bilateral gonadal vasculature with perivascular edema/inflammation. Gonadal vein thrombosis could cause this appearance, but lack of intravenous contrast on today's study hinders assessment. 3. Trace free fluid in the cul-de-sac. This can be physiologic in a premenopausal female. 4. 5.8 cm left ovarian cyst without evidence for internal septation or with asymmetric wall thickening by CT imaging. Pelvic ultrasound may prove helpful to further evaluate. 5. Cholelithiasis without biliary dilatation.    Medical Consultants:  GYN Hematology  Other Consultants:  Physical therapy  IAnti-Infectives:   Vancomycin 1-11 --> 01/03/2016 Zosyn 1-11 --> 01/05/2016   Signed:  Leisa Lenz, MD  [facility] 01/05/2016, 2:15 PM  Pager #: [phone]  Time  spent in minutes: less than 30 minutes  Discharge Exam: Filed Vitals:   01/05/16 0445 01/05/16 1334  BP: 117/74 120/79  Pulse: 63 80  Temp: 97.7 F (36.5 C) 98.4 F (36.9 C)  Resp: 20 20   Filed Vitals:   01/04/16 1435 01/04/16 2145 01/05/16 0445 01/05/16 1334  BP: 114/72 107/68 117/74 120/79  Pulse: 88 77 63 80  Temp: 98.7 F (37.1 C) 98.6 F (37 C) 97.7 F (36.5 C) 98.4 F (36.9 C)  TempSrc: Oral Oral Oral Oral  Resp: _0 Height:      Weight:      SpO2: 100% 100% 99% 99%    General: Pt is alert, follows commands appropriately, not in acute distress Cardiovascular: Regular rate and rhythm, S1/S2 +, no murmurs Respiratory: Clear to auscultation bilaterally, no wheezing, no crackles, no rhonchi Abdominal: Soft, non tender, non distended, bowel sounds +, no guarding Extremities: no edema, no cyanosis, pulses palpable bilaterally DP and PT Neuro: Grossly nonfocal  Discharge Instructions  Discharge Instructions    Call MD for:  difficulty breathing, headache or visual disturbances    Complete by:  As directed      Call MD for:  persistant dizziness or light-headedness    Complete by:  As directed      Call MD for:  persistant nausea and vomiting    Complete by:  As directed      Call MD for:  severe uncontrolled pain    Complete by:  As directed      Diet - low sodium heart healthy    Complete by:  As directed      Discharge  instructions    Complete by:  As directed   Continue vantin on discharge for 2 weeks (for strep bacteremia and E.Coli UTI) Repeat blood culture is negative Please have PCP referral for MRI on outpatient basis to evaluate ovarian mass / cyst     Increase activity slowly    Complete by:  As directed             Medication List    STOP taking these medications        ADVIL 200 MG tablet  Generic drug:  ibuprofen      TAKE these medications        cefpodoxime 200 MG tablet  Commonly known as:  VANTIN  Take 1 tablet (200 mg  total) by mouth 2 (two) times daily.     megestrol 40 MG tablet  Commonly known as:  MEGACE  Take 1 tablet (40 mg total) by mouth daily.     pantoprazole 40 MG tablet  Commonly known as:  PROTONIX  Take 1 tablet (40 mg total) by mouth daily.     WOMENS ONE DAILY PO  Take 1 tablet by mouth daily.           Follow-up Information    Follow up with [facility]. Schedule an appointment as soon as possible for a visit in 1 week.   Specialty:  Gynecologic Oncology   Why:  Follow up appt after recent hospitalization; may call me on my cell number if any questions [phone], alma devine   Contact information:   [address] [id number] [place] [phone]       The results of significant diagnostics from this hospitalization (including imaging, microbiology, ancillary and laboratory) are listed below for reference.    Significant Diagnostic Studies: Dg Chest 2 View  12/31/2015  CLINICAL DATA:  LEFT side chest pain and abdomen, shortness of breath, fever and dry cough since yesterday EXAM: CHEST  2 VIEW COMPARISON:  02/12/2004 FINDINGS: Upper normal heart size. Normal mediastinal contours and pulmonary vascularity. Mild chronic central peribronchial thickening. No pulmonary infiltrate, pleural effusion or pneumothorax. Bones unremarkable. IMPRESSION: Mild chronic bronchitic changes without acute infiltrate. Electronically Signed   By: Lavonia Dana M.D.   On: 12/31/2015 15:32   US Transvaginal Non-ob  12/31/2015  CLINICAL DATA:  Nausea, vomiting. Question of fibroids and left ovarian cyst by CT today. Heavy bleeding. EXAM: TRANSABDOMINAL AND TRANSVAGINAL ULTRASOUND OF PELVIS TECHNIQUE: Both transabdominal and transvaginal ultrasound examinations of the pelvis were performed. Transabdominal technique was performed for global imaging of the pelvis including uterus, ovaries, adnexal regions, and pelvic cul-de-sac. It  was necessary to proceed with endovaginal exam following the transabdominal exam to visualize the endometrium. COMPARISON:  CT of the abdomen and pelvis 12/31/2015 FINDINGS: Uterus Measurements: Uterus is at least 14.5 x 9.8 x 9.5 cm. A circumscribed hypoechoic heterogeneous mass is identified in the central uterus, likely displacing the endometrial canal and it measures 6.7 x 5.5 x 5.3 cm. This is suspected to be a submucosal fibroid. Within the lower uterine segment there is a heterogeneous slightly hyperechoic mass in the central cervix measuring 5.0 x 3.0 x 5.2 cm. Small cystic lesion is identified in the posterior wall of the cervix measuring 2.7 x 1.0 x 2.1 cm. Endometrium Thickness: Obscured by fibroid, described above. Right ovary Measurements: 3.6 x 1.8 x 2.7 cm. Follicles are present; normal appearance. Left ovary Measurements: 8.9 x 6.2 x 5.7 cm. Normal ovarian tissue  appears replaced by homogeneous hypoechoic and vascular tissue, suspicious for neoplasm. Other findings Trace free pelvic fluid. IMPRESSION: 1. Complex appearance of the uterus. Probable central fibroid displaces the endometrial canal. Central cervical mass could represent a fibroid, complex nabothian cyst, or malignancy. Cystic lesion also noted in the posterior aspect of the cervix. 2. Left ovarian mass is suspicious for neoplasm. Further evaluation is recommended. 3. Consider MRI for further characterization of these findings prior to surgical evaluation. Electronically Signed   By: Nolon Nations M.D.   On: 12/31/2015 19:19   US Pelvis Complete  12/31/2015  CLINICAL DATA:  Nausea, vomiting. Question of fibroids and left ovarian cyst by CT today. Heavy bleeding. EXAM: TRANSABDOMINAL AND TRANSVAGINAL ULTRASOUND OF PELVIS TECHNIQUE: Both transabdominal and transvaginal ultrasound examinations of the pelvis were performed. Transabdominal technique was performed for global imaging of the pelvis including uterus, ovaries, adnexal regions,  and pelvic cul-de-sac. It was necessary to proceed with endovaginal exam following the transabdominal exam to visualize the endometrium. COMPARISON:  CT of the abdomen and pelvis 12/31/2015 FINDINGS: Uterus Measurements: Uterus is at least 14.5 x 9.8 x 9.5 cm. A circumscribed hypoechoic heterogeneous mass is identified in the central uterus, likely displacing the endometrial canal and it measures 6.7 x 5.5 x 5.3 cm. This is suspected to be a submucosal fibroid. Within the lower uterine segment there is a heterogeneous slightly hyperechoic mass in the central cervix measuring 5.0 x 3.0 x 5.2 cm. Small cystic lesion is identified in the posterior wall of the cervix measuring 2.7 x 1.0 x 2.1 cm. Endometrium Thickness: Obscured by fibroid, described above. Right ovary Measurements: 3.6 x 1.8 x 2.7 cm. Follicles are present; normal appearance. Left ovary Measurements: 8.9 x 6.2 x 5.7 cm. Normal ovarian tissue appears replaced by homogeneous hypoechoic and vascular tissue, suspicious for neoplasm. Other findings Trace free pelvic fluid. IMPRESSION: 1. Complex appearance of the uterus. Probable central fibroid displaces the endometrial canal. Central cervical mass could represent a fibroid, complex nabothian cyst, or malignancy. Cystic lesion also noted in the posterior aspect of the cervix. 2. Left ovarian mass is suspicious for neoplasm. Further evaluation is recommended. 3. Consider MRI for further characterization of these findings prior to surgical evaluation. Electronically Signed   By: Nolon Nations M.D.   On: 12/31/2015 19:19   Ct Abdomen Pelvis W Contrast  12/31/2015  CLINICAL DATA:  Three day history of left upper quadrant and left back pain with nausea and vomiting. EXAM: CT ABDOMEN AND PELVIS WITH CONTRAST TECHNIQUE: Multidetector CT imaging of the abdomen and pelvis was performed using the standard protocol following bolus administration of intravenous contrast. CONTRAST:  143m OMNIPAQUE IOHEXOL 300  MG/ML  SOLN COMPARISON:  None. FINDINGS: Lower chest:  Unremarkable. Hepatobiliary: No focal abnormality within the liver parenchyma. 8 mm calcified stone identified in the gallbladder. No intrahepatic or extrahepatic biliary dilation. Pancreas: No focal mass lesion. No dilatation of the main duct. No intraparenchymal cyst. No peripancreatic edema. Spleen: No splenomegaly. No focal mass lesion. Adrenals/Urinary Tract: No adrenal nodule or mass. 6 mm hypodensity in the upper pole right kidney is too small to characterize but likely represents a cyst. Left kidney is unremarkable. No evidence for hydroureter. The urinary bladder appears normal for the degree of distention. Stomach/Bowel: Stomach is nondistended. No gastric wall thickening. No evidence of outlet obstruction. Duodenum is normally positioned as is the ligament of Treitz. No small bowel wall thickening. No small bowel dilatation. The terminal ileum is normal. The appendix is normal.  Cecal tip is high in the right abdomen. No gross colonic mass. No colonic wall thickening. No substantial diverticular change. Vascular/Lymphatic: No abdominal aortic aneurysm. No abdominal atherosclerotic calcification. There is no gastrohepatic or hepatoduodenal ligament lymphadenopathy. No intraperitoneal or retroperitoneal lymphadenopy. No pelvic sidewall lymphadenopathy. The gonadal vasculature is prominent bilaterally with perivascular edema/inflammation. Reproductive: Uterus appears markedly enlarged measuring 10.6 x 15.2 x 9.8 cm. Anterior intramural lesion measures 5.9 x 6.6 x 7.3 cm and displaces the endometrial cavity posteriorly. This is probably a degenerated fibroid. Left ovary is prominent with 4.1 x 5.8 cm cystic lesion within the parenchyma. The left gonadal vasculature is engorged. Right ovary is posteriorly positioned in the cul-de-sac and right gonadal vasculature is also quite prominent. Other: Trace free fluid is seen in the cul-de-sac. Musculoskeletal:  Bone windows reveal no worrisome lytic or sclerotic osseous lesions. IMPRESSION: 1. Enlarged uterus with complex anterior intramural cystic lesion, presumably representing age degenerated, intramural anterior uterine fibroid with substantial mass effect on the endometrial stripe. Pelvic ultrasound may prove helpful to further evaluate. 2. Prominent bilateral gonadal vasculature with perivascular edema/inflammation. Gonadal vein thrombosis could cause this appearance, but lack of intravenous contrast on today's study hinders assessment. 3. Trace free fluid in the cul-de-sac. This can be physiologic in a premenopausal female. 4. 5.8 cm left ovarian cyst without evidence for internal septation or with asymmetric wall thickening by CT imaging. Pelvic ultrasound may prove helpful to further evaluate. 5. Cholelithiasis without biliary dilatation. Electronically Signed   By: Misty Stanley M.D.   On: 12/31/2015 17:56   Mr Attempted Daymon Larsen Report  01/05/2016  This examination belongs to an outside facility and is stored here for comparison purposes only.  Contact the originating outside institution for any associated report or interpretation.   Microbiology: Recent Results (from the past 240 hour(s))  Urine culture     Status: None   Collection Time: 12/31/15  4:02 PM  Result Value Ref Range Status   Specimen Description URINE, RANDOM  Final   Special Requests NONE  Final   Culture   Final    >=100,000 COLONIES/mL ESCHERICHIA COLI Performed at [facility]    Report Status 01/03/2016 FINAL  Final   Organism ID, Bacteria ESCHERICHIA COLI  Final      Susceptibility   Escherichia coli - MIC*    AMPICILLIN <=2 SENSITIVE Sensitive     CEFAZOLIN <=4 SENSITIVE Sensitive     CEFTRIAXONE <=1 SENSITIVE Sensitive     CIPROFLOXACIN <=0.25 SENSITIVE Sensitive     GENTAMICIN <=1 SENSITIVE Sensitive     IMIPENEM <=0.25 SENSITIVE Sensitive     NITROFURANTOIN <=16 SENSITIVE Sensitive     TRIMETH/SULFA  <=20 SENSITIVE Sensitive     AMPICILLIN/SULBACTAM <=2 SENSITIVE Sensitive     PIP/TAZO <=4 SENSITIVE Sensitive     * >=100,000 COLONIES/mL ESCHERICHIA COLI  Blood culture (routine x 2)     Status: None   Collection Time: 12/31/15  9:03 PM  Result Value Ref Range Status   Specimen Description BLOOD RIGHT WRIST  Final   Special Requests BOTTLES DRAWN AEROBIC AND ANAEROBIC 5CC  Final   Culture  Setup Time   Final    GRAM POSITIVE COCCI IN CHAINS IN BOTH AEROBIC AND ANAEROBIC BOTTLES CRITICAL RESULT CALLED TO, READ BACK BY AND VERIFIED WITH: [facility] AT 5883 01/01/16 BY L BENFIELD    Culture   Final    STREPTOCOCCUS PYOGENES HEALTH DEPARTMENT NOTIFIED Performed at [facility]    Report Status  01/03/2016 FINAL  Final   Organism ID, Bacteria STREPTOCOCCUS PYOGENES  Final      Susceptibility   Streptococcus pyogenes - MIC*    CLINDAMYCIN <=0.25 SENSITIVE Sensitive     AMPICILLIN <=0.25 SENSITIVE Sensitive     ERYTHROMYCIN <=0.12 SENSITIVE Sensitive     VANCOMYCIN 0.5 SENSITIVE Sensitive     CEFTRIAXONE <=0.12 SENSITIVE Sensitive     LEVOFLOXACIN 0.5 SENSITIVE Sensitive     * STREPTOCOCCUS PYOGENES  MRSA PCR Screening     Status: None   Collection Time: 01/01/16  3:25 AM  Result Value Ref Range Status   MRSA by PCR NEGATIVE NEGATIVE Final    Comment:        The GeneXpert MRSA Assay (FDA approved for NASAL specimens only), is one component of a comprehensive MRSA colonization surveillance program. It is not intended to diagnose MRSA infection nor to guide or monitor treatment for MRSA infections.   Blood culture (routine x 2)     Status: None (Preliminary result)   Collection Time: 01/01/16 12:45 PM  Result Value Ref Range Status   Specimen Description BLOOD LEFT ARM  Final   Special Requests BOTTLES DRAWN AEROBIC AND ANAEROBIC 10CC  Final   Culture   Final    NO GROWTH 4 DAYS Performed at [facility]    Report Status PENDING  Incomplete  Culture,  blood (routine x 2)     Status: None (Preliminary result)   Collection Time: 01/03/16 12:00 PM  Result Value Ref Range Status   Specimen Description BLOOD LEFT ANTECUBITAL  Final   Special Requests BOTTLES DRAWN AEROBIC AND ANAEROBIC 10CC  Final   Culture   Final    NO GROWTH 2 DAYS Performed at [facility]    Report Status PENDING  Incomplete  Culture, blood (routine x 2)     Status: None (Preliminary result)   Collection Time: 01/03/16 12:05 PM  Result Value Ref Range Status   Specimen Description BLOOD LEFT ARM  Final   Special Requests BOTTLES DRAWN AEROBIC AND ANAEROBIC 10CC  Final   Culture   Final    NO GROWTH 2 DAYS Performed at [facility]    Report Status PENDING  Incomplete     Labs: Basic Metabolic Panel:  Recent Labs Lab 12/31/15 1410 01/01/16 1005 01/02/16 0328 01/04/16 0516 01/05/16 0412  NA 135 136 136 141 141  K 2.8* 3.4* 3.3* 3.1* 3.0*  CL 102 105 107 112* 110  CO2 18* 23 20* 21* 21*  GLUCOSE 143* 110* 93 95 95  BUN _0 <5* <5*  CREATININE 1.11* 0.72 0.64 0.83 0.80  CALCIUM 9.4 8.1* 7.9* 8.5* 8.1*  MG  --  1.7  --   --   --    Liver Function Tests:  Recent Labs Lab 12/31/15 1410  AST 40  ALT 13*  ALKPHOS 43  BILITOT 0.6  PROT 7.8  ALBUMIN 3.6    Recent Labs Lab 12/31/15 1410  LIPASE 26   No results for input(s): AMMONIA in the last 168 hours. CBC:  Recent Labs Lab 12/31/15 1410 12/31/15 1527  01/02/16 2147 01/03/16 0729 01/03/16 1955 01/04/16 0516 01/05/16 0412  WBC 13.5* 13.5*  < > 11.3* 11.3* 9.1 8.0 6.1  NEUTROABS 12.9* 12.5*  --   --   --   --   --   --   HGB 3.6* 3.7*  < > 8.7* 8.7* 8.7* 8.6* 8.1*  HCT 13.9* 13.8*  < >  27.0* 26.7* 27.8* 27.4* 26.2*  MCV 62.3* 61.6*  < > 75.6* 74.0* 74.7* 76.3* 76.8*  PLT 118* 117*  < > 94* 100* 130* 124* 135*  < > = values in this interval not displayed. Cardiac Enzymes: No results for input(s): CKTOTAL, CKMB, CKMBINDEX, TROPONINI in the last 168  hours. BNP: BNP (last 3 results) No results for input(s): BNP in the last 8760 hours.  ProBNP (last 3 results) No results for input(s): PROBNP in the last 8760 hours.  CBG:  Recent Labs Lab 12/31/15 1405 01/02/16 0732 01/03/16 0747 01/04/16 0753 01/05/16 0742  GLUCAP 134* 92 95 108* 88

## 2016-01-05 NOTE — Progress Notes (Signed)
Pt stated she was not claustrophobic prior to coming down to mri. Pt was placed on table and while being slid into scanner she informed us she wanted out of the scanner. Pt was then informed that i could request medication from the physician taking care of her for medication to reduce her axeity. At which point she declined stating. " I dont think i can do this" RN was informed of this. i have paged Dr Charlies Silvers to inform her of this. Currently awaiting call back to inform her of the pt's decision to refuse the mri.

## 2016-01-05 NOTE — Progress Notes (Signed)
Occupational Therapy Treatment AND Discharge  Patient Details Name: Alicia Carson MRN: [id number] DOB: 1976-05-20 Today's Date: 01/05/2016    History of present illness Pt was admitted with abdominal pain, nausea, vomiting, increased urinary frequency and fever.  Dx'd with uti and sepsis.  Hgb 3.6 on admission; s/p 4 units PRBC.  Pt with fibroids & has L ovarian mass/cyst.  h/o heavy menses   OT comments  Patient has met all OT goals. Discharge from acute OT services with no f/u or equipment recommendations at this time. IF pt needs additional OT, please write new order. Thank you!    Follow Up Recommendations  No OT follow up;Supervision - Intermittent    Equipment Recommendations  None recommended by OT    Recommendations for Other Services  None at this time  Precautions / Restrictions Precautions Precautions: Fall Precaution Comments: monitor HR Restrictions Weight Bearing Restrictions: No    Mobility Bed Mobility Overal bed mobility: Modified Independent General bed mobility comments: HOB raised  Transfers Overall transfer level: Modified independent General transfer comment: slightly increased time    Balance Overall balance assessment: Modified Independent   ADL Overall ADL's : Modified independent General ADL Comments: Pt able to reach BLEs without any pain, crossing BLEs over knees. Pt ambulated into BR for toilet transfer at mod I level. Pt reports to this OT that she has a toilet riser that bolts into her toilet. Am discharging patient from OT services and no follow up recommendations or equipment recommended at this time.      Cognition   Behavior During Therapy: WFL for tasks assessed/performed Overall Cognitive Status: Within Functional Limits for tasks assessed                 Pertinent Vitals/ Pain       Pain Assessment: Faces Faces Pain Scale: Hurts a little bit Pain Location: lower abdomen  Pain Descriptors / Indicators:  Guarding;Discomfort Pain Intervention(s): Monitored during session   Frequency   d/c from acute OT services     Progress Toward Goals  OT Goals(current goals can now befound in the care plan section)  Progress towards OT goals: Goals met/education completed, patient discharged from OT  Acute Rehab OT Goals Patient Stated Goal: figure out what's going on OT Goal Formulation: All assessment and education complete, DC therapy  Plan All goals met and education completed, patient discharged from OT services    Activity Tolerance Patient tolerated treatment well  Patient Left in chair;with call bell/phone within reach    Time: 0925-0937 OT Time Calculation (min): 12 min  Charges: OT General Charges $OT Visit: 1 Procedure OT Treatments $Self Care/Home Management : 8-22 mins   Occupational Therapy Discharge Patient Details Name: Alicia Carson MRN: [id number] DOB: 1976/06/28 Today's Date: 01/05/2016  Patient discharged from OT services secondary to goals met and no further OT needs identified.  Progress and discharge plan discussed with patient and/or caregiver: Patient/Caregiver agrees with plan   Chrys Racer , MS, OTR/L, CLT Pager: [phone]  01/05/2016, 9:45 AM

## 2016-01-05 NOTE — Progress Notes (Signed)
Completed D/C teaching with patient. Gave prescriptions. Answered all questions. Patient will be D/C home with family in stable condition

## 2016-01-05 NOTE — Discharge Instructions (Signed)
Abnormal Uterine Bleeding °Abnormal uterine bleeding means bleeding from the vagina that is not your normal menstrual period. This can be: °· Bleeding or spotting between periods. °· Bleeding after sex (sexual intercourse). °· Bleeding that is heavier or more than normal. °· Periods that last longer than usual. °· Bleeding after menopause. °There are many problems that may cause this. Treatment will depend on the cause of the bleeding. Any kind of bleeding that is not normal should be reviewed by your doctor.  °HOME CARE °Watch your condition for any changes. These actions may lessen any discomfort you are having: °· Do not use tampons or douches as told by your doctor. °· Change your pads often. °You should get regular pelvic exams and Pap tests. Keep all appointments for tests as told by your doctor. °GET HELP IF: °· You are bleeding for more than 1 week. °· You feel dizzy at times. °GET HELP RIGHT AWAY IF:  °· You pass out. °· You have to change pads every 15 to 30 minutes. °· You have belly pain. °· You have a fever. °· You become sweaty or weak. °· You are passing large blood clots from the vagina. °· You feel sick to your stomach (nauseous) and throw up (vomit). °MAKE SURE YOU: °· Understand these instructions. °· Will watch your condition. °· Will get help right away if you are not doing well or get worse. °  °This information is not intended to replace advice given to you by your health care provider. Make sure you discuss any questions you have with your health care provider. °  °Document Released: 10/03/2009 Document Revised: 12/11/2013 Document Reviewed: 07/05/2013 °Elsevier Interactive Patient Education ©2016 [employer] ° °

## 2016-01-06 ENCOUNTER — Telehealth: Payer: Self-pay | Admitting: Hematology

## 2016-01-06 LAB — CULTURE, BLOOD (ROUTINE X 2): CULTURE: NO GROWTH

## 2016-01-06 NOTE — Telephone Encounter (Signed)
Attempted to contact pt regarding hospital f/u. Pt vm not set up can't leave mess.

## 2016-01-06 NOTE — Telephone Encounter (Signed)
PT RETURNED [place] F/U

## 2016-01-08 LAB — CULTURE, BLOOD (ROUTINE X 2)
CULTURE: NO GROWTH
CULTURE: NO GROWTH

## 2016-01-09 ENCOUNTER — Telehealth: Payer: Self-pay | Admitting: Hematology

## 2016-01-09 ENCOUNTER — Other Ambulatory Visit: Payer: Self-pay | Admitting: Hematology

## 2016-01-09 NOTE — Telephone Encounter (Signed)
Confirm with pt change in time on 1/27 for lab, md, and infusion. Pt confirmed 10:30 appt start time

## 2016-01-16 ENCOUNTER — Other Ambulatory Visit (HOSPITAL_BASED_OUTPATIENT_CLINIC_OR_DEPARTMENT_OTHER): Payer: 59

## 2016-01-16 ENCOUNTER — Other Ambulatory Visit: Payer: 59

## 2016-01-16 ENCOUNTER — Telehealth: Payer: Self-pay | Admitting: Hematology

## 2016-01-16 ENCOUNTER — Ambulatory Visit (HOSPITAL_BASED_OUTPATIENT_CLINIC_OR_DEPARTMENT_OTHER): Payer: 59 | Admitting: Hematology

## 2016-01-16 ENCOUNTER — Ambulatory Visit (HOSPITAL_BASED_OUTPATIENT_CLINIC_OR_DEPARTMENT_OTHER): Payer: 59

## 2016-01-16 ENCOUNTER — Inpatient Hospital Stay: Payer: 59 | Admitting: Hematology

## 2016-01-16 VITALS — BP 124/78 | HR 67 | Temp 98.2°F | Resp 18

## 2016-01-16 VITALS — BP 141/80 | HR 103 | Temp 99.5°F | Resp 19 | Wt 170.0 lb

## 2016-01-16 DIAGNOSIS — D500 Iron deficiency anemia secondary to blood loss (chronic): Secondary | ICD-10-CM

## 2016-01-16 LAB — CBC & DIFF AND RETIC
BASO%: 2.6 % — AB (ref 0.0–2.0)
BASOS ABS: 0.1 10*3/uL (ref 0.0–0.1)
EOS%: 6.1 % (ref 0.0–7.0)
Eosinophils Absolute: 0.2 10*3/uL (ref 0.0–0.5)
HCT: 32 % — ABNORMAL LOW (ref 34.8–46.6)
HGB: 10 g/dL — ABNORMAL LOW (ref 11.6–15.9)
IMMATURE RETIC FRACT: 13.5 % — AB (ref 1.60–10.00)
LYMPH#: 1.3 10*3/uL (ref 0.9–3.3)
LYMPH%: 33.5 % (ref 14.0–49.7)
MCH: 24.1 pg — ABNORMAL LOW (ref 25.1–34.0)
MCHC: 31.4 g/dL — ABNORMAL LOW (ref 31.5–36.0)
MCV: 76.9 fL — ABNORMAL LOW (ref 79.5–101.0)
MONO#: 0.4 10*3/uL (ref 0.1–0.9)
MONO%: 9.9 % (ref 0.0–14.0)
NEUT#: 1.9 10*3/uL (ref 1.5–6.5)
NEUT%: 47.9 % (ref 38.4–76.8)
PLATELETS: 196 10*3/uL (ref 145–400)
RBC: 4.17 10*6/uL (ref 3.70–5.45)
RDW: 32.4 % — ABNORMAL HIGH (ref 11.2–14.5)
RETIC CT ABS: 0.07 10*3/uL — AB (ref 33.70–90.70)
Retic %: 1.6 % (ref 0.70–2.10)
WBC: 3.9 10*3/uL (ref 3.9–10.3)

## 2016-01-16 LAB — IRON AND TIBC
%SAT: 5 % — ABNORMAL LOW (ref 21–57)
Iron: 21 ug/dL — ABNORMAL LOW (ref 41–142)
TIBC: 461 ug/dL — AB (ref 236–444)
UIBC: 440 ug/dL — AB (ref 120–384)

## 2016-01-16 LAB — FERRITIN: Ferritin: 43 ng/ml (ref 9–269)

## 2016-01-16 MED ORDER — SODIUM CHLORIDE 0.9 % IV SOLN
Freq: Once | INTRAVENOUS | Status: AC
  Administered 2016-01-16: 14:00:00 via INTRAVENOUS

## 2016-01-16 MED ORDER — SODIUM CHLORIDE 0.9 % IV SOLN
510.0000 mg | Freq: Once | INTRAVENOUS | Status: AC
  Administered 2016-01-16: 510 mg via INTRAVENOUS
  Filled 2016-01-16: qty 17

## 2016-01-16 NOTE — Patient Instructions (Signed)

## 2016-01-16 NOTE — Progress Notes (Signed)
[facility]  Telephone:(336) [phone] [phone]) [phone]  Clinic Follow up Note   No care team member to display 01/16/2016  DIAGNOSIS: Iron deficient anemia  CHIEF COMPLAIN: Follow up anemia  HISTORY OF PRESENTING ILLNESS (01/01/2016):  Alicia Carson 40 y.o. female with past medical history of uterine fibroid, heavy menstrual period, hypothyroidism, Presented to [facility] emergency room with worsening fatigue, nausea vomiting, left abdominal pain, Urinary frequency and fever. Her symptom has started about few weeks ago, and the fever has been going on for 3 days. She has menstrual period once a months, It lasts about 5-6 days, heavy Everyday, she changes pad every 2-3 hours. She denies any other bleeding.   She used to have a PCP, but have not seen a doctor for several years. She had a history of hypothyroidism from hemi- thyroidectomy, supposed to take Synthroid which she has not done for 4 years. She denies any other significant medical history  CURRENT THERAPY: pending Feraheme, she received a total of 5u RBC in 12/2015  INTERVAL HISTORY: Alicia Carson returns for follow-up. I initially saw her in the hospital 2 weeks ago. She received 5 units of RBC in the hospital for severe anemia. She was also treated for UTI and sepsis. She overall feels much better, normal fever, abdominal discomfort, and dysuria. She feels well overall. She was put on Megace in the hospital, her menstrual period accurred a few days ago, but has been much lighter than before.  REVIEW OF SYSTEMS:   Constitutional: Denies fevers, chills or abnormal weight loss, (+) wight loss  Eyes: Denies blurriness of vision Ears, nose, mouth, throat, and face: Denies mucositis or sore throat Respiratory: Denies cough, dyspnea or wheezes Cardiovascular: Denies palpitation, chest discomfort or lower extremity swelling Gastrointestinal:  Denies nausea, heartburn or change in bowel habits Skin: Denies  abnormal skin rashes Lymphatics: Denies new lymphadenopathy or easy bruising Neurological:Denies numbness, tingling or new weaknesses Behavioral/Psych: Mood is stable, no new changes  All other systems were reviewed with the patient and are negative.  MEDICAL HISTORY:  Past Medical History  Diagnosis Date  . Thyroid disease     hypo    SURGICAL HISTORY: Past Surgical History  Procedure Laterality Date  . Neck surgery Left 2005  . Leep  2006    I have reviewed the social history and family history with the patient and they are unchanged from previous note.  ALLERGIES:  is allergic to advil pm.  MEDICATIONS:  Current Outpatient Prescriptions  Medication Sig Dispense Refill  . cefpodoxime (VANTIN) 200 MG tablet Take 1 tablet (200 mg total) by mouth 2 (two) times daily. 28 tablet 0  . megestrol (MEGACE) 40 MG tablet Take 1 tablet (40 mg total) by mouth daily. 30 tablet 0  . Multiple Vitamins-Minerals (WOMENS ONE DAILY PO) Take 1 tablet by mouth daily.    . pantoprazole (PROTONIX) 40 MG tablet Take 1 tablet (40 mg total) by mouth daily. 14 tablet 0   No current facility-administered medications for this visit.    PHYSICAL EXAMINATION: ECOG PERFORMANCE STATUS: 0 - Asymptomatic  Filed Vitals:   01/16/16 1135  BP: 141/80  Pulse: 103  Temp: 99.5 F (37.5 C)  Resp: 19   Filed Weights   01/16/16 1135  Weight: 170 lb (77.111 kg)    GENERAL:alert, no distress and comfortable SKIN: skin color, texture, turgor are normal, no rashes or significant lesions EYES: normal, Conjunctiva are pink and non-injected, sclera clear OROPHARYNX:no exudate, no erythema and  lips, buccal mucosa, and tongue normal  NECK: supple, thyroid normal size, non-tender, without nodularity LYMPH:  no palpable lymphadenopathy in the cervical, axillary or inguinal LUNGS: clear to auscultation and percussion with normal breathing effort HEART: regular rate & rhythm and no murmurs and no lower extremity  edema ABDOMEN:abdomen soft, non-tender and normal bowel sounds Musculoskeletal:no cyanosis of digits and no clubbing  NEURO: alert & oriented x 3 with fluent speech, no focal motor/sensory deficits  LABORATORY DATA:  I have reviewed the data as listed CBC Latest Ref Rng 01/16/2016 01/05/2016 01/04/2016  WBC 3.9 - 10.3 10e3/uL 3.9 6.1 8.0  Hemoglobin 11.6 - 15.9 g/dL 10.0(L) 8.1(L) 8.6(L)  Hematocrit 34.8 - 46.6 % 32.0(L) 26.2(L) 27.4(L)  Platelets 145 - 400 10e3/uL 196 135(L) 124(L)     CMP Latest Ref Rng 01/05/2016 01/04/2016 01/02/2016  Glucose 65 - 99 mg/dL 95 95 93  BUN 6 - 20 mg/dL <5(L) <5(L) 8  Creatinine 0.44 - 1.00 mg/dL 0.80 0.83 0.64  Sodium 135 - 145 mmol/L 141 141 136  Potassium 3.5 - 5.1 mmol/L 3.0(L) 3.1(L) 3.3(L)  Chloride 101 - 111 mmol/L 110 112(H) 107  CO2 22 - 32 mmol/L 21(L) 21(L) 20(L)  Calcium 8.9 - 10.3 mg/dL 8.1(L) 8.5(L) 7.9(L)  Total Protein 6.5 - 8.1 g/dL - - -  Total Bilirubin 0.3 - 1.2 mg/dL - - -  Alkaline Phos 38 - 126 U/L - - -  AST 15 - 41 U/L - - -  ALT 14 - 54 U/L - - -   Results for Alicia Carson (MRN [id number]) as of 01/18/2016 10:56  Ref. Range 12/31/2015 15:24 01/02/2016 03:28 01/16/2016 11:12  Iron Latest Ref Range: 41-142 ug/dL 9 (L)  21 (L)  UIBC Latest Ref Range: 120-384 ug/dL 512  440 (H)  TIBC Latest Ref Range: 236-444 ug/dL 521 (H)  461 (H)  %SAT Latest Ref Range: 21-57 %   5 (L)  Saturation Ratios Latest Ref Range: 10.4-31.8 % 2 (L)    Ferritin Latest Ref Range: 9-269 ng/ml 22  43  Folate Latest Ref Range: >5.9 ng/mL  15.9      RADIOGRAPHIC STUDIES: I have personally reviewed the radiological images as listed and agreed with the findings in the report. No results found.   ASSESSMENT & PLAN:  40 yo female, with past medical history of hypothyroidism, uterine fibroids, heavy menstrual periods, presented with severe anemia with a hemoglobin 3.6g/dl  1. Microcytic, hypo-productive anemia, secondary to iron deficiency -Her initial  lab reviewed including 3.6, MCV 62.3, with absolute reticular count 34, ferritin 22, serum iron 9, elevated TIBC 521, and transferrin saturation 2%, this is consistent with microcytic hypo-productive anemia, likely secondary to iron deficiency and menorrhagia -Her TSH, LDH was normal.  -She will receive IV Feraheme 510 mg today and the next week. -Due to the very low MCV, I also obtained a hemoglobin electrophoresis to ruled out hemoglobinopathy -If her anemia does not resolve after IV Feraheme, I'll obtain further anemia workup.  2. Thrombocytopenia -She had a mild thrombocytopenia when she was found to have severe anemia and heavy menstrual bleeding. -Resolved now. Her thrombocytopenia was likely secondary to her heavy bleeding.  3. Menorrhagia, uterine fibroid, left ovary and ovarian mass -Her menorrhagia has improved with Megace. -Her pelvic ultrasound on 12/31/2015 showed a left ovarian mass. -She is scheduled to see gynecologist next week.  4. History of Hypothyroidism -Her TSH was normal on 12/31/2015 -I encouraged her to have a primary care physician and  a follow-up.   Plan -IV Feraheme today and next week -Repeat lab in one month, I'll see her back in 2 months with lab..  All questions were answered. The patient knows to call the clinic with any problems, questions or concerns. No barriers to learning was detected.  I spent 25 minutes counseling the patient face to face. The total time spent in the appointment was 30 minutes and more than 50% was on counseling and review of test results     Truitt Merle, MD 01/16/2016

## 2016-01-16 NOTE — Telephone Encounter (Signed)
Pt confirmed labs/ov per 01/27 POF, gave pt AVS and Calendar.Cherylann Banas, sent msg to MD concerning pt's Feraheme schedule

## 2016-01-18 ENCOUNTER — Encounter: Payer: Self-pay | Admitting: Hematology

## 2016-01-19 ENCOUNTER — Ambulatory Visit: Payer: 59

## 2016-01-19 LAB — HEMOGLOBINOPATHY EVALUATION
HEMOGLOBIN F QUANTITATION: 0 % (ref 0.0–2.0)
HGB C: 0 %
HGB S: 0 %
Hemoglobin A2 Quantitation: 2 % (ref 0.7–3.1)
Hgb A: 98 % (ref 94.0–98.0)

## 2016-01-20 ENCOUNTER — Telehealth: Payer: Self-pay | Admitting: Hematology

## 2016-01-20 NOTE — Telephone Encounter (Signed)
Patient aware of her added iron appt

## 2016-01-22 ENCOUNTER — Other Ambulatory Visit (HOSPITAL_COMMUNITY)
Admission: RE | Admit: 2016-01-22 | Discharge: 2016-01-22 | Disposition: A | Discharge status: Home / Self Care | Payer: 59 | Source: Ambulatory Visit | Attending: Obstetrics and Gynecology | Admitting: Obstetrics and Gynecology

## 2016-01-22 ENCOUNTER — Encounter: Payer: Self-pay | Admitting: Obstetrics and Gynecology

## 2016-01-22 ENCOUNTER — Ambulatory Visit (INDEPENDENT_AMBULATORY_CARE_PROVIDER_SITE_OTHER): Payer: 59 | Admitting: Obstetrics and Gynecology

## 2016-01-22 VITALS — BP 123/86 | HR 105 | Temp 98.8°F | Ht 61.75 in | Wt 165.5 lb

## 2016-01-22 DIAGNOSIS — D62 Acute posthemorrhagic anemia: Secondary | ICD-10-CM | POA: Diagnosis not present

## 2016-01-22 DIAGNOSIS — D500 Iron deficiency anemia secondary to blood loss (chronic): Secondary | ICD-10-CM | POA: Diagnosis not present

## 2016-01-22 DIAGNOSIS — N920 Excessive and frequent menstruation with regular cycle: Secondary | ICD-10-CM | POA: Diagnosis not present

## 2016-01-22 DIAGNOSIS — N924 Excessive bleeding in the premenopausal period: Secondary | ICD-10-CM | POA: Diagnosis not present

## 2016-01-22 DIAGNOSIS — Z3202 Encounter for pregnancy test, result negative: Secondary | ICD-10-CM | POA: Diagnosis not present

## 2016-01-22 LAB — POCT PREGNANCY, URINE: PREG TEST UR: NEGATIVE

## 2016-01-22 NOTE — Progress Notes (Signed)
CLINIC ENCOUNTER NOTE  History:  39 y.o. [id number] here today for bleeding f/u.  Earlier this month hospitalized for severe anemia and uti/sepsis. Presented for menorrhagia. H 3.7, transfused 5 units, treated with antibiotics. Had heme f/u and recently had feraheme, on 1/27. Next dose tomorrow. U/s showed fibroid, cervical mass, and l ovarian mass concerning for malignancy. Ca-125 wnl. Still bleeding, 3 pads a day. Last pap smear 2010. Hx leep prior to that. 2010 pap normal per patient. Taking megace 40 qd.   Past Medical History  Diagnosis Date  . Thyroid disease     hypo  . Vaginal Pap smear, abnormal     Past Surgical History  Procedure Laterality Date  . Neck surgery Left 2005  . Leep  2006    The following portions of the patient's history were reviewed and updated as appropriate: allergies, current medications, past family history, past medical history, past social history, past surgical history and problem list.    Review of Systems:  See above; comprehensive review of systems was otherwise negative.  Objective:  Physical Exam BP 123/86 mmHg  Pulse 105  Temp(Src) 98.8 F (37.1 C)  Ht 5' 1.75" (1.568 m)  Wt 165 lb 8 oz (75.07 kg)  BMI 30.53 kg/m2  LMP 12/28/2015 (Exact Date) CONSTITUTIONAL: Well-developed, well-nourished female in no acute distress.  HENT:  Normocephalic, atraumatic SKIN: Skin is warm and dry.  [place]: Alert  PSYCHIATRIC: Normal mood and affect.  CARDIOVASCULAR: Normal heart rate noted RESPIRATORY: Effort and breath sounds normal, no problems with respiration noted ABDOMEN: Soft, no distention noted.  No tenderness, rebound or guarding.  SSE: large mass extending from cervical os, cervical os partially obstructed. Grasped with tenaculum by Dr. Elly Modena and rotated but did not remove. Biopsy obtained. EMB obtained   Labs and Imaging Dg Chest 2 View  12/31/2015  CLINICAL DATA:  LEFT side chest pain and abdomen, shortness of breath, fever and dry  cough since yesterday EXAM: CHEST  2 VIEW COMPARISON:  02/12/2004 FINDINGS: Upper normal heart size. Normal mediastinal contours and pulmonary vascularity. Mild chronic central peribronchial thickening. No pulmonary infiltrate, pleural effusion or pneumothorax. Bones unremarkable. IMPRESSION: Mild chronic bronchitic changes without acute infiltrate. Electronically Signed   By: Lavonia Dana M.D.   On: 12/31/2015 15:32   US Transvaginal Non-ob  12/31/2015  CLINICAL DATA:  Nausea, vomiting. Question of fibroids and left ovarian cyst by CT today. Heavy bleeding. EXAM: TRANSABDOMINAL AND TRANSVAGINAL ULTRASOUND OF PELVIS TECHNIQUE: Both transabdominal and transvaginal ultrasound examinations of the pelvis were performed. Transabdominal technique was performed for global imaging of the pelvis including uterus, ovaries, adnexal regions, and pelvic cul-de-sac. It was necessary to proceed with endovaginal exam following the transabdominal exam to visualize the endometrium. COMPARISON:  CT of the abdomen and pelvis 12/31/2015 FINDINGS: Uterus Measurements: Uterus is at least 14.5 x 9.8 x 9.5 cm. A circumscribed hypoechoic heterogeneous mass is identified in the central uterus, likely displacing the endometrial canal and it measures 6.7 x 5.5 x 5.3 cm. This is suspected to be a submucosal fibroid. Within the lower uterine segment there is a heterogeneous slightly hyperechoic mass in the central cervix measuring 5.0 x 3.0 x 5.2 cm. Small cystic lesion is identified in the posterior wall of the cervix measuring 2.7 x 1.0 x 2.1 cm. Endometrium Thickness: Obscured by fibroid, described above. Right ovary Measurements: 3.6 x 1.8 x 2.7 cm. Follicles are present; normal appearance. Left ovary Measurements: 8.9 x 6.2 x 5.7 cm. Normal ovarian tissue appears replaced  by homogeneous hypoechoic and vascular tissue, suspicious for neoplasm. Other findings Trace free pelvic fluid. IMPRESSION: 1. Complex appearance of the uterus. Probable  central fibroid displaces the endometrial canal. Central cervical mass could represent a fibroid, complex nabothian cyst, or malignancy. Cystic lesion also noted in the posterior aspect of the cervix. 2. Left ovarian mass is suspicious for neoplasm. Further evaluation is recommended. 3. Consider MRI for further characterization of these findings prior to surgical evaluation. Electronically Signed   By: Nolon Nations M.D.   On: 12/31/2015 19:19   US Pelvis Complete  12/31/2015  CLINICAL DATA:  Nausea, vomiting. Question of fibroids and left ovarian cyst by CT today. Heavy bleeding. EXAM: TRANSABDOMINAL AND TRANSVAGINAL ULTRASOUND OF PELVIS TECHNIQUE: Both transabdominal and transvaginal ultrasound examinations of the pelvis were performed. Transabdominal technique was performed for global imaging of the pelvis including uterus, ovaries, adnexal regions, and pelvic cul-de-sac. It was necessary to proceed with endovaginal exam following the transabdominal exam to visualize the endometrium. COMPARISON:  CT of the abdomen and pelvis 12/31/2015 FINDINGS: Uterus Measurements: Uterus is at least 14.5 x 9.8 x 9.5 cm. A circumscribed hypoechoic heterogeneous mass is identified in the central uterus, likely displacing the endometrial canal and it measures 6.7 x 5.5 x 5.3 cm. This is suspected to be a submucosal fibroid. Within the lower uterine segment there is a heterogeneous slightly hyperechoic mass in the central cervix measuring 5.0 x 3.0 x 5.2 cm. Small cystic lesion is identified in the posterior wall of the cervix measuring 2.7 x 1.0 x 2.1 cm. Endometrium Thickness: Obscured by fibroid, described above. Right ovary Measurements: 3.6 x 1.8 x 2.7 cm. Follicles are present; normal appearance. Left ovary Measurements: 8.9 x 6.2 x 5.7 cm. Normal ovarian tissue appears replaced by homogeneous hypoechoic and vascular tissue, suspicious for neoplasm. Other findings Trace free pelvic fluid. IMPRESSION: 1. Complex  appearance of the uterus. Probable central fibroid displaces the endometrial canal. Central cervical mass could represent a fibroid, complex nabothian cyst, or malignancy. Cystic lesion also noted in the posterior aspect of the cervix. 2. Left ovarian mass is suspicious for neoplasm. Further evaluation is recommended. 3. Consider MRI for further characterization of these findings prior to surgical evaluation. Electronically Signed   By: Nolon Nations M.D.   On: 12/31/2015 19:19   Ct Abdomen Pelvis W Contrast  12/31/2015  CLINICAL DATA:  Three day history of left upper quadrant and left back pain with nausea and vomiting. EXAM: CT ABDOMEN AND PELVIS WITH CONTRAST TECHNIQUE: Multidetector CT imaging of the abdomen and pelvis was performed using the standard protocol following bolus administration of intravenous contrast. CONTRAST:  175mL OMNIPAQUE IOHEXOL 300 MG/ML  SOLN COMPARISON:  None. FINDINGS: Lower chest:  Unremarkable. Hepatobiliary: No focal abnormality within the liver parenchyma. 8 mm calcified stone identified in the gallbladder. No intrahepatic or extrahepatic biliary dilation. Pancreas: No focal mass lesion. No dilatation of the main duct. No intraparenchymal cyst. No peripancreatic edema. Spleen: No splenomegaly. No focal mass lesion. Adrenals/Urinary Tract: No adrenal nodule or mass. 6 mm hypodensity in the upper pole right kidney is too small to characterize but likely represents a cyst. Left kidney is unremarkable. No evidence for hydroureter. The urinary bladder appears normal for the degree of distention. Stomach/Bowel: Stomach is nondistended. No gastric wall thickening. No evidence of outlet obstruction. Duodenum is normally positioned as is the ligament of Treitz. No small bowel wall thickening. No small bowel dilatation. The terminal ileum is normal. The appendix is normal. Cecal tip  is high in the right abdomen. No gross colonic mass. No colonic wall thickening. No substantial  diverticular change. Vascular/Lymphatic: No abdominal aortic aneurysm. No abdominal atherosclerotic calcification. There is no gastrohepatic or hepatoduodenal ligament lymphadenopathy. No intraperitoneal or retroperitoneal lymphadenopy. No pelvic sidewall lymphadenopathy. The gonadal vasculature is prominent bilaterally with perivascular edema/inflammation. Reproductive: Uterus appears markedly enlarged measuring 10.6 x 15.2 x 9.8 cm. Anterior intramural lesion measures 5.9 x 6.6 x 7.3 cm and displaces the endometrial cavity posteriorly. This is probably a degenerated fibroid. Left ovary is prominent with 4.1 x 5.8 cm cystic lesion within the parenchyma. The left gonadal vasculature is engorged. Right ovary is posteriorly positioned in the cul-de-sac and right gonadal vasculature is also quite prominent. Other: Trace free fluid is seen in the cul-de-sac. Musculoskeletal: Bone windows reveal no worrisome lytic or sclerotic osseous lesions. IMPRESSION: 1. Enlarged uterus with complex anterior intramural cystic lesion, presumably representing age degenerated, intramural anterior uterine fibroid with substantial mass effect on the endometrial stripe. Pelvic ultrasound may prove helpful to further evaluate. 2. Prominent bilateral gonadal vasculature with perivascular edema/inflammation. Gonadal vein thrombosis could cause this appearance, but lack of intravenous contrast on today's study hinders assessment. 3. Trace free fluid in the cul-de-sac. This can be physiologic in a premenopausal female. 4. 5.8 cm left ovarian cyst without evidence for internal septation or with asymmetric wall thickening by CT imaging. Pelvic ultrasound may prove helpful to further evaluate. 5. Cholelithiasis without biliary dilatation. Electronically Signed   By: Misty Stanley M.D.   On: 12/31/2015 17:56   Mr Attempted Daymon Larsen Report  01/05/2016  This examination belongs to an outside facility and is stored here for comparison purposes  only.  Contact the originating outside institution for any associated report or interpretation.   Assessment & Plan:   # Menorrhagia: with recent severe acute blood loss anemia. U/s showing cervical mass and uterine fibroid and left ovarian mass concerning for malignancy. Ca 125 wnl. Today what appears to be large partially obstructing fibroid seen on SSE. Cervical os vicualized. Unable to obtain pap but was able to obtain EMB. Attempted to remove mass but unable to do so 2/2 patient pain. Biopsy obtained.  - continue megace - f/u path - surgical consult here in 2 weeks  Routine preventative health maintenance measures emphasized.     Deatra Mcmahen B. Cassiel Fernandez, [place] for [employer], [place]

## 2016-01-23 ENCOUNTER — Ambulatory Visit (HOSPITAL_BASED_OUTPATIENT_CLINIC_OR_DEPARTMENT_OTHER): Payer: 59

## 2016-01-23 VITALS — BP 116/73 | HR 89 | Temp 98.5°F | Resp 18

## 2016-01-23 DIAGNOSIS — D500 Iron deficiency anemia secondary to blood loss (chronic): Secondary | ICD-10-CM

## 2016-01-23 MED ORDER — SODIUM CHLORIDE 0.9 % IV SOLN
510.0000 mg | Freq: Once | INTRAVENOUS | Status: AC
  Administered 2016-01-23: 510 mg via INTRAVENOUS
  Filled 2016-01-23: qty 17

## 2016-01-23 MED ORDER — SODIUM CHLORIDE 0.9 % IV SOLN
Freq: Once | INTRAVENOUS | Status: AC
  Administered 2016-01-23: 11:00:00 via INTRAVENOUS

## 2016-01-23 MED ORDER — HEPARIN SOD (PORK) LOCK FLUSH 100 UNIT/ML IV SOLN
250.0000 [IU] | Freq: Once | INTRAVENOUS | Status: DC | PRN
  Filled 2016-01-23: qty 5

## 2016-01-23 NOTE — Patient Instructions (Signed)

## 2016-01-26 ENCOUNTER — Telehealth: Payer: Self-pay | Admitting: General Practice

## 2016-01-26 DIAGNOSIS — N938 Other specified abnormal uterine and vaginal bleeding: Secondary | ICD-10-CM

## 2016-01-26 MED ORDER — MEGESTROL ACETATE 40 MG PO TABS: 40.0000 mg | ORAL_TABLET | Freq: Every day | ORAL | Status: DC

## 2016-01-26 NOTE — Telephone Encounter (Signed)
Per Dr Si Raider, patient's endo bx was negative. The mass extending from her cervix appears to be a fibroid. Patient should follow up with Dr Elly Modena as currently scheduled.

## 2016-01-26 NOTE — Telephone Encounter (Signed)
Patient called and left message stating she had an appt with Dr Si Raider on 2/2 and was supposed to get a prescription for Megace. Patient states it still hasn't been sent to her pharmacy yet. Called patient, no answer- unable to leave message due to no voicemail set up.

## 2016-01-26 NOTE — Telephone Encounter (Addendum)
Rx for Megace was sent today by Dr. Si Raider. Called pt to notify however no answer and still no active mailbox.  2/7  1120  Called pt and informed her of Endo Bx results negative and the mass from her cervix appears to be a fibroid. I also advised her that Rx for Megace was sent to her pharmacy yesterday and should be ready for pick up. She may have some rebound bleeding once the Megace is finished which is normal. If the bleeding is heavy and lasts for more that 3-4 days, she should call us back.  Pt voiced understanding of all information and instructions given.

## 2016-01-29 ENCOUNTER — Other Ambulatory Visit: Payer: Self-pay | Admitting: Hematology

## 2016-01-29 ENCOUNTER — Telehealth: Payer: Self-pay | Admitting: *Deleted

## 2016-01-29 ENCOUNTER — Telehealth: Payer: Self-pay | Admitting: Hematology

## 2016-01-29 DIAGNOSIS — D500 Iron deficiency anemia secondary to blood loss (chronic): Secondary | ICD-10-CM

## 2016-01-29 NOTE — Telephone Encounter (Signed)
perp of to sch pt appt-pt aware °

## 2016-01-29 NOTE — Telephone Encounter (Signed)
Spoke with patient.  She does not need an iron infusion tomorrow.  Instead, Dr. Burr Medico would just like to check her lab work.  She has already had two iron infusions - on 1/27 and 2/3.   We will see what her lab is tomorrow and then decide on further iron infusions.  We will cancel lab appt. For 02/16/16.

## 2016-01-30 ENCOUNTER — Ambulatory Visit: Payer: 59

## 2016-02-05 ENCOUNTER — Encounter: Payer: Self-pay | Admitting: Obstetrics and Gynecology

## 2016-02-05 ENCOUNTER — Other Ambulatory Visit (HOSPITAL_COMMUNITY)
Admission: RE | Admit: 2016-02-05 | Discharge: 2016-02-05 | Disposition: A | Discharge status: Home / Self Care | Payer: 59 | Source: Ambulatory Visit | Attending: Obstetrics and Gynecology | Admitting: Obstetrics and Gynecology

## 2016-02-05 ENCOUNTER — Ambulatory Visit (INDEPENDENT_AMBULATORY_CARE_PROVIDER_SITE_OTHER): Payer: 59 | Admitting: Obstetrics and Gynecology

## 2016-02-05 VITALS — BP 120/94 | HR 115 | Temp 98.4°F | Ht 61.0 in | Wt 167.0 lb

## 2016-02-05 DIAGNOSIS — R52 Pain, unspecified: Secondary | ICD-10-CM

## 2016-02-05 DIAGNOSIS — N938 Other specified abnormal uterine and vaginal bleeding: Secondary | ICD-10-CM | POA: Insufficient documentation

## 2016-02-05 DIAGNOSIS — D259 Leiomyoma of uterus, unspecified: Secondary | ICD-10-CM | POA: Diagnosis not present

## 2016-02-05 MED ORDER — IBUPROFEN 200 MG PO TABS
600.0000 mg | ORAL_TABLET | Freq: Once | ORAL | Status: AC
  Administered 2016-02-05: 600 mg via ORAL

## 2016-02-05 NOTE — Progress Notes (Signed)
Patient ID: Alicia Carson, female   DOB: 15-May-1976, 40 y.o.   MRN: [id number] 40 yo G2P2 presenting today for follow up and discuss results of endometrial biopsy. Patient reports significant improvement in her vaginal bleeding since taking Megace. A cervical mass was visualized on exam and patient returns to have it removed. She describes using 2 pad daily instead of her normal 6 pads + 3 tampons daily. She denies CP, lightheadedness/dizziness  Past Medical History  Diagnosis Date  . Thyroid disease     hypo  . Vaginal Pap smear, abnormal    Past Surgical History  Procedure Laterality Date  . Neck surgery Left 2005  . Leep  2006   Family History  Problem Relation Age of Onset  . Anemia Mother   . Hypertension Father   . Hypertension Sister   . Breast cancer Cousin     Mother's cousin had breast cancer and 5 of her daughters also have cancer  . Breast cancer Cousin   . Breast cancer Cousin   . Uterine cancer Neg Hx   . Colon cancer Neg Hx   . Ovarian cancer Neg Hx    Social History  Substance Use Topics  . Smoking status: Never Smoker   . Smokeless tobacco: Never Used  . Alcohol Use: No   ROS See pertinent in HPI  Blood pressure 120/94, pulse 115, temperature 98.4 F (36.9 C), temperature source Oral, height 5\' 1"  (1.549 m), weight 167 lb (75.751 kg), last menstrual period 01/13/2016. GENERAL: Well-developed, well-nourished female in no acute distress.  ABDOMEN: Soft, nontender, nondistended.  PELVIC: Normal external female genitalia. Vagina is pink and rugated.  Normal discharge. Normal appearing cervix with a 5 cm mass protruding from the cervical os. Uterus is normal in size. No adnexal mass or tenderness. EXTREMITIES: No cyanosis, clubbing, or edema, 2+ distal pulses.  A/P 40 yo with DUB and fibroid uterus - Endometrial biopsy results results reviewed with the patient - Cervical fibroid removed following the injection of 10 cc 2% lidocaine as a paracervical block.  Using a tenaculum, the fibroid was grasped and gently twisted until it was detached from the uterus. The patient tolerated the procedure well - Patient was advised to continue with Megace for now. She may discontinue if her bleeding resolve. If DUB persists, she may benefit from medical management with an IUD. Patient would like to defer surgical options for now - RTC prn

## 2016-02-05 NOTE — Patient Instructions (Signed)
Levonorgestrel intrauterine device (IUD) What is this medicine? LEVONORGESTREL IUD (LEE voe nor jes trel) is a contraceptive (birth control) device. The device is placed inside the uterus by a healthcare professional. It is used to prevent pregnancy and can also be used to treat heavy bleeding that occurs during your period. Depending on the device, it can be used for 3 to 5 years. This medicine may be used for other purposes; ask your health care provider or pharmacist if you have questions. What should I tell my health care provider before I take this medicine? They need to know if you have any of these conditions: -abnormal Pap smear -cancer of the breast, uterus, or cervix -diabetes -endometritis -genital or pelvic infection now or in the past -have more than one sexual partner or your partner has more than one partner -heart disease -history of an ectopic or tubal pregnancy -immune system problems -IUD in place -liver disease or tumor -problems with blood clots or take blood-thinners -use intravenous drugs -uterus of unusual shape -vaginal bleeding that has not been explained -an unusual or allergic reaction to levonorgestrel, other hormones, silicone, or polyethylene, medicines, foods, dyes, or preservatives -pregnant or trying to get pregnant -breast-feeding How should I use this medicine? This device is placed inside the uterus by a health care professional. Talk to your pediatrician regarding the use of this medicine in children. Special care may be needed. Overdosage: If you think you have taken too much of this medicine contact a poison control center or emergency room at once. NOTE: This medicine is only for you. Do not share this medicine with others. What if I miss a dose? This does not apply. What may interact with this medicine? Do not take this medicine with any of the following medications: -amprenavir -bosentan -fosamprenavir This medicine may also interact with  the following medications: -aprepitant -barbiturate medicines for inducing sleep or treating seizures -bexarotene -griseofulvin -medicines to treat seizures like carbamazepine, ethotoin, felbamate, oxcarbazepine, phenytoin, topiramate -modafinil -pioglitazone -rifabutin -rifampin -rifapentine -some medicines to treat HIV infection like atazanavir, indinavir, lopinavir, nelfinavir, tipranavir, ritonavir -St. John's wort -warfarin This list may not describe all possible interactions. Give your health care provider a list of all the medicines, herbs, non-prescription drugs, or dietary supplements you use. Also tell them if you smoke, drink alcohol, or use illegal drugs. Some items may interact with your medicine. What should I watch for while using this medicine? Visit your doctor or health care professional for regular check ups. See your doctor if you or your partner has sexual contact with others, becomes HIV positive, or gets a sexual transmitted disease. This product does not protect you against HIV infection (AIDS) or other sexually transmitted diseases. You can check the placement of the IUD yourself by reaching up to the top of your vagina with clean fingers to feel the threads. Do not pull on the threads. It is a good habit to check placement after each menstrual period. Call your doctor right away if you feel more of the IUD than just the threads or if you cannot feel the threads at all. The IUD may come out by itself. You may become pregnant if the device comes out. If you notice that the IUD has come out use a backup birth control method like condoms and call your health care provider. Using tampons will not change the position of the IUD and are okay to use during your period. What side effects may I notice from receiving this medicine?   Side effects that you should report to your doctor or health care professional as soon as possible: -allergic reactions like skin rash, itching or  hives, swelling of the face, lips, or tongue -fever, flu-like symptoms -genital sores -high blood pressure -no menstrual period for 6 weeks during use -pain, swelling, warmth in the leg -pelvic pain or tenderness -severe or sudden headache -signs of pregnancy -stomach cramping -sudden shortness of breath -trouble with balance, talking, or walking -unusual vaginal bleeding, discharge -yellowing of the eyes or skin Side effects that usually do not require medical attention (report to your doctor or health care professional if they continue or are bothersome): -acne -breast pain -change in sex drive or performance -changes in weight -cramping, dizziness, or faintness while the device is being inserted -headache -irregular menstrual bleeding within first 3 to 6 months of use -nausea This list may not describe all possible side effects. Call your doctor for medical advice about side effects. You may report side effects to FDA at 1-800-FDA-1088. Where should I keep my medicine? This does not apply. NOTE: This sheet is a summary. It may not cover all possible information. If you have questions about this medicine, talk to your doctor, pharmacist, or health care provider.    2016, Elsevier/Gold Standard. (2012-01-06 13:54:04)  

## 2016-02-16 ENCOUNTER — Other Ambulatory Visit: Payer: 59

## 2016-02-18 ENCOUNTER — Encounter: Payer: Self-pay | Admitting: *Deleted

## 2016-02-25 ENCOUNTER — Other Ambulatory Visit: Payer: Self-pay | Admitting: Obstetrics and Gynecology

## 2016-02-25 DIAGNOSIS — N938 Other specified abnormal uterine and vaginal bleeding: Secondary | ICD-10-CM

## 2016-02-25 MED ORDER — MEGESTROL ACETATE 40 MG PO TABS: 40.0000 mg | ORAL_TABLET | Freq: Every day | ORAL | Status: DC

## 2016-02-25 NOTE — Addendum Note (Signed)
Addended by: Laurey Arrow B on: 02/25/2016 02:49 PM   Modules accepted: Orders

## 2016-03-12 ENCOUNTER — Telehealth: Payer: Self-pay | Admitting: Hematology

## 2016-03-12 NOTE — Telephone Encounter (Signed)
pt cld left voicemail wanted to r/s appt-cld & spoke to pt and gave pt r/s time & date for 4/6 @ 1:30

## 2016-03-15 ENCOUNTER — Ambulatory Visit: Payer: 59 | Admitting: Hematology

## 2016-03-15 ENCOUNTER — Other Ambulatory Visit: Payer: 59

## 2016-03-25 ENCOUNTER — Telehealth: Payer: Self-pay | Admitting: Hematology

## 2016-03-25 ENCOUNTER — Encounter: Payer: Self-pay | Admitting: Hematology

## 2016-03-25 ENCOUNTER — Ambulatory Visit (HOSPITAL_BASED_OUTPATIENT_CLINIC_OR_DEPARTMENT_OTHER): Payer: 59 | Admitting: Hematology

## 2016-03-25 ENCOUNTER — Other Ambulatory Visit (HOSPITAL_BASED_OUTPATIENT_CLINIC_OR_DEPARTMENT_OTHER): Payer: 59

## 2016-03-25 VITALS — BP 115/80 | HR 99 | Temp 100.0°F | Resp 20 | Ht 61.0 in | Wt 185.3 lb

## 2016-03-25 DIAGNOSIS — D500 Iron deficiency anemia secondary to blood loss (chronic): Secondary | ICD-10-CM

## 2016-03-25 LAB — CBC & DIFF AND RETIC
BASO%: 0.4 % (ref 0.0–2.0)
Basophils Absolute: 0 10*3/uL (ref 0.0–0.1)
EOS%: 10 % — ABNORMAL HIGH (ref 0.0–7.0)
Eosinophils Absolute: 0.5 10*3/uL (ref 0.0–0.5)
HCT: 40.4 % (ref 34.8–46.6)
HGB: 13.8 g/dL (ref 11.6–15.9)
Immature Retic Fract: 5.5 % (ref 1.60–10.00)
LYMPH%: 35.2 % (ref 14.0–49.7)
MCH: 30.8 pg (ref 25.1–34.0)
MCHC: 34.2 g/dL (ref 31.5–36.0)
MCV: 90.2 fL (ref 79.5–101.0)
MONO#: 0.4 10*3/uL (ref 0.1–0.9)
MONO%: 9.2 % (ref 0.0–14.0)
NEUT%: 45.2 % (ref 38.4–76.8)
NEUTROS ABS: 2.1 10*3/uL (ref 1.5–6.5)
NRBC: 0 % (ref 0–0)
PLATELETS: 182 10*3/uL (ref 145–400)
RBC: 4.48 10*6/uL (ref 3.70–5.45)
RDW: 13.5 % (ref 11.2–14.5)
Retic %: 0.58 % — ABNORMAL LOW (ref 0.70–2.10)
Retic Ct Abs: 25.98 10*3/uL — ABNORMAL LOW (ref 33.70–90.70)
WBC: 4.6 10*3/uL (ref 3.9–10.3)
lymph#: 1.6 10*3/uL (ref 0.9–3.3)

## 2016-03-25 LAB — COMPREHENSIVE METABOLIC PANEL
ALT: 22 U/L (ref 0–55)
ANION GAP: 8 meq/L (ref 3–11)
AST: 20 U/L (ref 5–34)
Albumin: 3.8 g/dL (ref 3.5–5.0)
Alkaline Phosphatase: 38 U/L — ABNORMAL LOW (ref 40–150)
BUN: 6 mg/dL — ABNORMAL LOW (ref 7.0–26.0)
CHLORIDE: 110 meq/L — AB (ref 98–109)
CO2: 22 meq/L (ref 22–29)
Calcium: 9.6 mg/dL (ref 8.4–10.4)
Creatinine: 0.9 mg/dL (ref 0.6–1.1)
Glucose: 82 mg/dl (ref 70–140)
POTASSIUM: 4.2 meq/L (ref 3.5–5.1)
SODIUM: 141 meq/L (ref 136–145)
Total Bilirubin: 0.41 mg/dL (ref 0.20–1.20)
Total Protein: 8.2 g/dL (ref 6.4–8.3)

## 2016-03-25 LAB — IRON AND TIBC
%SAT: 12 % — AB (ref 21–57)
IRON: 58 ug/dL (ref 41–142)
TIBC: 493 ug/dL — AB (ref 236–444)
UIBC: 435 ug/dL — AB (ref 120–384)

## 2016-03-25 LAB — FERRITIN: Ferritin: 17 ng/ml (ref 9–269)

## 2016-03-25 NOTE — Progress Notes (Signed)
[facility]  [phone] [phone]  Clinic Follow up Note   Patient Care Team: No Pcp Per Patient as PCP - General (General Practice) 03/25/2016  DIAGNOSIS: Iron deficient anemia  CHIEF COMPLAIN: Follow up anemia  HISTORY OF PRESENTING ILLNESS (01/01/2016):  Alicia Carson 40 y.o. female with past medical history of uterine fibroid, heavy menstrual period, hypothyroidism, Presented to [facility] emergency room with worsening fatigue, nausea vomiting, left abdominal pain, Urinary frequency and fever. Her symptom has started about few weeks ago, and the fever has been going on for 3 days. She has menstrual period once a months, It lasts about 5-6 days, heavy Everyday, she changes pad every 2-3 hours. She denies any other bleeding.   She used to have a PCP, but have not seen a doctor for several years. She had a history of hypothyroidism from hemi- thyroidectomy, supposed to take Synthroid which she has not done for 4 years. She denies any other significant medical history  CURRENT THERAPY:  1. IV Feraheme as needed, she received on 01/16/16 and 01/23/2016  2. Oral ferrous sulfate 1 tablet daily 3. she received a total of 5u RBC in 12/2015  INTERVAL HISTORY: Alicia Carson returns for follow-up. She is doing very well overall. She had her uteral fibroid removed in February 2017, a period has been very light since then, and it only lasted 3-4 days. Her energy level has been back to normal, she feels great. No other complaints.  REVIEW OF SYSTEMS:   Constitutional: Denies fevers, chills or abnormal weight loss. Eyes: Denies blurriness of vision Ears, nose, mouth, throat, and face: Denies mucositis or sore throat Respiratory: Denies cough, dyspnea or wheezes Cardiovascular: Denies palpitation, chest discomfort or lower extremity swelling Gastrointestinal:  Denies nausea, heartburn or change in bowel habits Skin: Denies abnormal skin  rashes Lymphatics: Denies new lymphadenopathy or easy bruising Neurological:Denies numbness, tingling or new weaknesses Behavioral/Psych: Mood is stable, no new changes  All other systems were reviewed with the patient and are negative.  MEDICAL HISTORY:  Past Medical History  Diagnosis Date  . Thyroid disease     hypo  . Vaginal Pap smear, abnormal     SURGICAL HISTORY: Past Surgical History  Procedure Laterality Date  . Neck surgery Left 2005  . Leep  2006    I have reviewed the social history and family history with the patient and they are unchanged from previous note.  ALLERGIES:  is allergic to advil pm.  MEDICATIONS:  Current Outpatient Prescriptions  Medication Sig Dispense Refill  . megestrol (MEGACE) 40 MG tablet Take 1 tablet (40 mg total) by mouth daily. 30 tablet 2  . Multiple Vitamins-Minerals (WOMENS ONE DAILY PO) Take 1 tablet by mouth daily.     No current facility-administered medications for this visit.    PHYSICAL EXAMINATION: ECOG PERFORMANCE STATUS: 0 - Asymptomatic  Filed Vitals:   03/25/16 1408  BP: 115/80  Pulse: 99  Temp: 100 F (37.8 C)  Resp: 20   Filed Weights   03/25/16 1408  Weight: 185 lb 4.8 oz (84.052 kg)    GENERAL:alert, no distress and comfortable SKIN: skin color, texture, turgor are normal, no rashes or significant lesions EYES: normal, Conjunctiva are pink and non-injected, sclera clear OROPHARYNX:no exudate, no erythema and lips, buccal mucosa, and tongue normal  NECK: supple, thyroid normal size, non-tender, without nodularity LYMPH:  no palpable lymphadenopathy in the cervical, axillary or inguinal LUNGS: clear to auscultation and percussion with normal breathing  effort HEART: regular rate & rhythm and no murmurs and no lower extremity edema ABDOMEN:abdomen soft, non-tender and normal bowel sounds Musculoskeletal:no cyanosis of digits and no clubbing  NEURO: alert & oriented x 3 with fluent speech, no focal  motor/sensory deficits  LABORATORY DATA:  I have reviewed the data as listed CBC Latest Ref Rng 03/25/2016 01/16/2016 01/05/2016  WBC 3.9 - 10.3 10e3/uL 4.6 3.9 6.1  Hemoglobin 11.6 - 15.9 g/dL 13.8 10.0(L) 8.1(L)  Hematocrit 34.8 - 46.6 % 40.4 32.0(L) 26.2(L)  Platelets 145 - 400 10e3/uL 182 196 135(L)     CMP Latest Ref Rng 03/25/2016 01/05/2016 01/04/2016  Glucose 70 - 140 mg/dl 82 95 95  BUN 7.0 - 26.0 mg/dL 6.0(L) <5(L) <5(L)  Creatinine 0.6 - 1.1 mg/dL 0.9 0.80 0.83  Sodium 136 - 145 mEq/L 141 141 141  Potassium 3.5 - 5.1 mEq/L 4.2 3.0(L) 3.1(L)  Chloride 101 - 111 mmol/L - 110 112(H)  CO2 22 - 29 mEq/L 22 21(L) 21(L)  Calcium 8.4 - 10.4 mg/dL 9.6 8.1(L) 8.5(L)  Total Protein 6.4 - 8.3 g/dL 8.2 - -  Total Bilirubin 0.20 - 1.20 mg/dL 0.41 - -  Alkaline Phos 40 - 150 U/L 38(L) - -  AST 5 - 34 U/L 20 - -  ALT 0 - 55 U/L 22 - -   Results for Alicia Carson, Alicia Carson (MRN [id number]) as of 01/18/2016 10:56  Ref. Range 12/31/2015 15:24 01/02/2016 03:28 01/16/2016 11:12  Iron Latest Ref Range: 41-142 ug/dL 9 (L)  21 (L)  UIBC Latest Ref Range: 120-384 ug/dL 512  440 (H)  TIBC Latest Ref Range: 236-444 ug/dL 521 (H)  461 (H)  %SAT Latest Ref Range: 21-57 %   5 (L)  Saturation Ratios Latest Ref Range: 10.4-31.8 % 2 (L)    Ferritin Latest Ref Range: 9-269 ng/ml 22  43  Folate Latest Ref Range: >5.9 ng/mL  15.9    PATHOLOGY REPORT Diagnosis 02/05/2016 Fibroid, cervical mass - LEIOMYOMA. - THERE IS NO EVIDENCE OF MALIGNANCY.  RADIOGRAPHIC STUDIES: I have personally reviewed the radiological images as listed and agreed with the findings in the report. No results found.   ASSESSMENT & PLAN:  40 yo female, with past medical history of hypothyroidism, uterine fibroids, heavy menstrual periods, presented with severe anemia with a hemoglobin 3.6g/dl  1. Iron deficient anemia -Her initial lab revealed Hb 3.6, MCV 62.3, with absolute reticular count 34, ferritin 22, serum iron 9, elevated TIBC 521,  and transferrin saturation 2%, this is consistent with microcytic hypo-productive anemia, likely secondary to iron deficiency and menorrhagia -Her TSH, LDH was normal.  -She received IV Feraheme twice  -Her anemia has resolved now, she feels normal.  2. Thrombocytopenia -She had a mild thrombocytopenia when she was found to have severe anemia and heavy menstrual bleeding. -Resolved now. Her thrombocytopenia was likely secondary to her heavy bleeding.  3. Menorrhagia, uterine fibroid, left ovary and ovarian mass -Her menorrhagia has improved with Megace. -Her pelvic ultrasound on 12/31/2015 showed a left ovarian mass. -She is s/p fibroid removal and her menorrhagia has resolved. She'll continue follow-up with her gynecologist  4. History of Hypothyroidism -Her TSH was normal on 12/31/2015 -I encouraged her to have a primary care physician and a follow-up.   Plan -Her CBC results were discussed with her, iron studies still pending, she'll check from my chart -Repeat lab in 3 months, I'll see her back in 6 months with lab..  All questions were answered. The patient knows to  call the clinic with any problems, questions or concerns. No barriers to learning was detected.  I spent 15 minutes counseling the patient face to face. The total time spent in the appointment was 20 minutes and more than 50% was on counseling and review of test results     Truitt Merle, MD 03/25/2016

## 2016-03-25 NOTE — Telephone Encounter (Signed)
per pof to sch pt appt-gave pt copy of avs °

## 2016-03-26 ENCOUNTER — Telehealth: Payer: Self-pay | Admitting: Hematology

## 2016-03-26 NOTE — Telephone Encounter (Signed)
per pof to sch pt appt-cld & left pt a message of time & date of 6/1 appt-adv t get upated copy of avs

## 2016-03-26 NOTE — Progress Notes (Signed)
Left message for pt to call back for message.

## 2016-03-26 NOTE — Progress Notes (Signed)
Left message for pt to return call.

## 2016-03-31 NOTE — Progress Notes (Signed)
Left message to call back  

## 2016-04-02 ENCOUNTER — Telehealth: Payer: Self-pay | Admitting: *Deleted

## 2016-04-02 NOTE — Telephone Encounter (Signed)
Pt called pt & message per Dr Burr Medico given.  She reports that she has been taking her iron & knows appts b/c she had seen them on MyChart.

## 2016-04-02 NOTE — Telephone Encounter (Signed)
Multiple messages left for pt to return call at ph # listed.  Called pt's emergency contact & talked with pt's mother & she gave me another ph # to try [phone].  Message left at this # to call back for message from Dr Burr Medico.

## 2016-04-02 NOTE — Telephone Encounter (Signed)
-----   Message from Truitt Merle, MD sent at 03/25/2016 11:01 PM EDT ----- Janifer Adie,  Please call her and let her know that her iron level is still low, but better than 2 months ago. Please encourage her to take iron pill daily, along with stool softener. I will change her lab appointment from every 3 month to every 2 months.   Thanks,  Krista Blue

## 2016-05-20 ENCOUNTER — Telehealth: Payer: Self-pay | Admitting: Hematology

## 2016-05-20 ENCOUNTER — Other Ambulatory Visit: Payer: 59

## 2016-05-20 NOTE — Telephone Encounter (Signed)
pt called needs to resched 6/1 apt. Shes goin out of town in June she requested july.

## 2016-05-26 ENCOUNTER — Encounter: Payer: Self-pay | Admitting: Obstetrics and Gynecology

## 2016-05-26 DIAGNOSIS — N938 Other specified abnormal uterine and vaginal bleeding: Secondary | ICD-10-CM

## 2016-05-27 ENCOUNTER — Other Ambulatory Visit: Payer: Self-pay | Admitting: Obstetrics and Gynecology

## 2016-05-27 MED ORDER — MEGESTROL ACETATE 40 MG PO TABS: 40.0000 mg | ORAL_TABLET | Freq: Every day | ORAL | Status: DC

## 2016-06-25 ENCOUNTER — Other Ambulatory Visit (HOSPITAL_BASED_OUTPATIENT_CLINIC_OR_DEPARTMENT_OTHER): Payer: 59

## 2016-06-25 DIAGNOSIS — D500 Iron deficiency anemia secondary to blood loss (chronic): Secondary | ICD-10-CM

## 2016-06-25 LAB — CBC & DIFF AND RETIC
BASO%: 1 % (ref 0.0–2.0)
Basophils Absolute: 0 10*3/uL (ref 0.0–0.1)
EOS%: 10.2 % — AB (ref 0.0–7.0)
Eosinophils Absolute: 0.4 10*3/uL (ref 0.0–0.5)
HCT: 41 % (ref 34.8–46.6)
HGB: 14 g/dL (ref 11.6–15.9)
IMMATURE RETIC FRACT: 8.5 % (ref 1.60–10.00)
LYMPH#: 1.7 10*3/uL (ref 0.9–3.3)
LYMPH%: 40.2 % (ref 14.0–49.7)
MCH: 30.9 pg (ref 25.1–34.0)
MCHC: 34.1 g/dL (ref 31.5–36.0)
MCV: 90.5 fL (ref 79.5–101.0)
MONO#: 0.3 10*3/uL (ref 0.1–0.9)
MONO%: 8.1 % (ref 0.0–14.0)
NEUT%: 40.5 % (ref 38.4–76.8)
NEUTROS ABS: 1.7 10*3/uL (ref 1.5–6.5)
PLATELETS: 199 10*3/uL (ref 145–400)
RBC: 4.53 10*6/uL (ref 3.70–5.45)
RDW: 16.4 % — ABNORMAL HIGH (ref 11.2–14.5)
Retic %: 1.06 % (ref 0.70–2.10)
Retic Ct Abs: 48.02 10*3/uL (ref 33.70–90.70)
WBC: 4.2 10*3/uL (ref 3.9–10.3)

## 2016-06-28 LAB — IRON AND TIBC
%SAT: 18 % — AB (ref 21–57)
Iron: 87 ug/dL (ref 41–142)
TIBC: 475 ug/dL — AB (ref 236–444)
UIBC: 388 ug/dL — AB (ref 120–384)

## 2016-06-28 LAB — FERRITIN: Ferritin: 21 ng/ml (ref 9–269)

## 2016-07-01 ENCOUNTER — Telehealth: Payer: Self-pay | Admitting: *Deleted

## 2016-07-01 NOTE — Telephone Encounter (Signed)
LM for rtn call on pt cell number/home number   Notes Recorded by Truitt Merle, MD on 06/29/2016 at 8:38 AM please let her know her CBC from 7/7 was normal, iron level has improved, no need for iv iron for now. I am not sure why she has lab appointment on 7/27, OK to cancel if no other reason, and return in 3 months as it scheduled.   Thanks  Truitt Merle  06/29/2016

## 2016-07-02 ENCOUNTER — Telehealth: Payer: Self-pay | Admitting: *Deleted

## 2016-07-02 NOTE — Telephone Encounter (Signed)
Reached pt & informed of CBC & iron level per Dr Burr Medico.  Discussed 07/15/16 appt & will cancel and she will keep next f/u appt.  She appreciated call.

## 2016-07-02 NOTE — Telephone Encounter (Signed)
-----   Message from Truitt Merle, MD sent at 06/29/2016  8:38 AM EDT ----- Alicia Carson, please let her know her CBC from 7/7 was normal, iron level has improved, no need for iv iron for now. I am not sure why she has lab appointment on 7/27, OK to cancel if no other reason, and return in 3 months as it scheduled.   Thanks  Truitt Merle  06/29/2016

## 2016-07-15 ENCOUNTER — Other Ambulatory Visit: Payer: 59

## 2016-07-20 ENCOUNTER — Other Ambulatory Visit: Payer: Self-pay | Admitting: Obstetrics and Gynecology

## 2016-07-20 DIAGNOSIS — N938 Other specified abnormal uterine and vaginal bleeding: Secondary | ICD-10-CM

## 2016-08-02 ENCOUNTER — Encounter: Payer: Self-pay | Admitting: Family Medicine

## 2016-08-02 ENCOUNTER — Ambulatory Visit (INDEPENDENT_AMBULATORY_CARE_PROVIDER_SITE_OTHER): Payer: 59 | Admitting: Obstetrics and Gynecology

## 2016-08-02 VITALS — BP 136/96 | Wt 199.2 lb

## 2016-08-02 DIAGNOSIS — Z3009 Encounter for other general counseling and advice on contraception: Secondary | ICD-10-CM | POA: Diagnosis not present

## 2016-08-02 DIAGNOSIS — N938 Other specified abnormal uterine and vaginal bleeding: Secondary | ICD-10-CM

## 2016-08-02 MED ORDER — MEGESTROL ACETATE 40 MG PO TABS: ORAL_TABLET | ORAL | 2 refills | Status: DC

## 2016-08-02 NOTE — Progress Notes (Signed)
Subjective:     Patient ID: SANDEEP FORESTIER, female   DOB: October 22, 1976, 40 y.o.   MRN: [id number]  Ms. Gerrity presents today to discuss contraception. She has a H/O uterine fibroids and heavy cycles. Her cycles are controlled with Megace. Sexual active on occasion and currently uses condoms.  Has used OCP's in the past.      Review of Systems  Constitutional: Negative.   Respiratory: Negative.   Cardiovascular: Negative.   Genitourinary: Negative.        Objective:   Physical Exam  Constitutional: She appears well-developed.  HENT:  Head: Normocephalic.  Cardiovascular: Normal rate and regular rhythm.   Pulmonary/Chest: Effort normal and breath sounds normal.  Abdominal: Soft. Bowel sounds are normal.       Assessment:     Contraception Education    Plan:   Discussed various options of contraception with Ms. Shon Baton. R/B of each reviewed. She declines IUD. Doing well with Megace, so could considering continuing with Megace and use condoms as needed.  BP elevated today. Advised to see PCP for further work up if needed. Will continue with Megace for now and reevaluate in 2 months.

## 2016-08-02 NOTE — Addendum Note (Signed)
Addended by: Chancy Milroy on: 08/02/2016 12:11 PM   Modules accepted: Orders

## 2016-09-09 ENCOUNTER — Other Ambulatory Visit: Payer: 59

## 2016-09-24 ENCOUNTER — Other Ambulatory Visit (HOSPITAL_BASED_OUTPATIENT_CLINIC_OR_DEPARTMENT_OTHER): Payer: 59

## 2016-09-24 ENCOUNTER — Ambulatory Visit: Payer: 59 | Admitting: Hematology

## 2016-09-24 DIAGNOSIS — D500 Iron deficiency anemia secondary to blood loss (chronic): Secondary | ICD-10-CM | POA: Diagnosis not present

## 2016-09-24 LAB — CBC & DIFF AND RETIC
BASO%: 0.4 % (ref 0.0–2.0)
Basophils Absolute: 0 10*3/uL (ref 0.0–0.1)
EOS%: 6.4 % (ref 0.0–7.0)
Eosinophils Absolute: 0.3 10*3/uL (ref 0.0–0.5)
HEMATOCRIT: 41.8 % (ref 34.8–46.6)
HGB: 14.7 g/dL (ref 11.6–15.9)
Immature Retic Fract: 2.4 % (ref 1.60–10.00)
LYMPH#: 1.4 10*3/uL (ref 0.9–3.3)
LYMPH%: 26.6 % (ref 14.0–49.7)
MCH: 32.2 pg (ref 25.1–34.0)
MCHC: 35.2 g/dL (ref 31.5–36.0)
MCV: 91.7 fL (ref 79.5–101.0)
MONO#: 0.5 10*3/uL (ref 0.1–0.9)
MONO%: 9.4 % (ref 0.0–14.0)
NEUT%: 57.2 % (ref 38.4–76.8)
NEUTROS ABS: 3 10*3/uL (ref 1.5–6.5)
PLATELETS: 198 10*3/uL (ref 145–400)
RBC: 4.56 10*6/uL (ref 3.70–5.45)
RDW: 13.5 % (ref 11.2–14.5)
Retic %: 0.86 % (ref 0.70–2.10)
Retic Ct Abs: 39.22 10*3/uL (ref 33.70–90.70)
WBC: 5.2 10*3/uL (ref 3.9–10.3)
nRBC: 0 % (ref 0–0)

## 2016-09-24 LAB — IRON AND TIBC
%SAT: 18 % — ABNORMAL LOW (ref 21–57)
Iron: 78 ug/dL (ref 41–142)
TIBC: 426 ug/dL (ref 236–444)
UIBC: 348 ug/dL (ref 120–384)

## 2016-09-24 LAB — FERRITIN: Ferritin: 49 ng/ml (ref 9–269)

## 2016-09-24 NOTE — Progress Notes (Deleted)
Alicia Carson  [phone] [phone]) [phone]  Clinic Follow up Note   Patient Care Team: No Pcp Per Patient as PCP - General (General Practice) 09/24/2016  DIAGNOSIS: Iron deficient anemia  CHIEF COMPLAIN: Follow up anemia  HISTORY OF PRESENTING ILLNESS (01/01/2016):  Alicia Carson 40 y.o. female with past medical history of uterine fibroid, heavy menstrual period, hypothyroidism, Presented to [facility] emergency room with worsening fatigue, nausea vomiting, left abdominal pain, Urinary frequency and fever. Her symptom has started about few weeks ago, and the fever has been going on for 3 days. She has menstrual period once a months, It lasts about 5-6 days, heavy Everyday, she changes pad every 2-3 hours. She denies any other bleeding.   She used to have a PCP, but have not seen a doctor for several years. She had a history of hypothyroidism from hemi- thyroidectomy, supposed to take Synthroid which she has not done for 4 years. She denies any other significant medical history  CURRENT THERAPY:  1. IV Feraheme as needed, she received on 01/16/16 and 01/23/2016  2. Oral ferrous sulfate 1 tablet daily 3. she received a total of 5u RBC in 12/2015  INTERVAL HISTORY: Ms Alicia Carson returns for follow-up. She is doing very well overall. She had her uteral fibroid removed in February 2017, a period has been very light since then, and it only lasted 3-4 days. Her energy level has been back to normal, she feels great. No other complaints.  REVIEW OF SYSTEMS:   Constitutional: Denies fevers, chills or abnormal weight loss. Eyes: Denies blurriness of vision Ears, nose, mouth, throat, and face: Denies mucositis or sore throat Respiratory: Denies cough, dyspnea or wheezes Cardiovascular: Denies palpitation, chest discomfort or lower extremity swelling Gastrointestinal:  Denies nausea, heartburn or change in bowel habits Skin: Denies abnormal skin  rashes Lymphatics: Denies new lymphadenopathy or easy bruising Neurological:Denies numbness, tingling or new weaknesses Behavioral/Psych: Mood is stable, no new changes  All other systems were reviewed with the patient and are negative.  MEDICAL HISTORY:  Past Medical History:  Diagnosis Date  . Thyroid disease    hypo  . Vaginal Pap smear, abnormal     SURGICAL HISTORY: Past Surgical History:  Procedure Laterality Date  . LEEP  2006  . NECK SURGERY Left 2005    I have reviewed the social history and family history with the patient and they are unchanged from previous note.  ALLERGIES:  is allergic to advil pm [ibuprofen-diphenhydramine cit].  MEDICATIONS:  Current Outpatient Prescriptions  Medication Sig Dispense Refill  . megestrol (MEGACE) 40 MG tablet TAKE 1 TABLET(40 MG) BY MOUTH DAILY 30 tablet 0  . megestrol (MEGACE) 40 MG tablet TAKE 1 TABLET(40 MG) BY MOUTH DAILY 30 tablet 2  . Multiple Vitamins-Minerals (WOMENS ONE DAILY PO) Take 1 tablet by mouth daily.     No current facility-administered medications for this visit.     PHYSICAL EXAMINATION: ECOG PERFORMANCE STATUS: 0 - Asymptomatic  There were no vitals filed for this visit. There were no vitals filed for this visit.  GENERAL:alert, no distress and comfortable SKIN: skin color, texture, turgor are normal, no rashes or significant lesions EYES: normal, Conjunctiva are pink and non-injected, sclera clear OROPHARYNX:no exudate, no erythema and lips, buccal mucosa, and tongue normal  NECK: supple, thyroid normal size, non-tender, without nodularity LYMPH:  no palpable lymphadenopathy in the cervical, axillary or inguinal LUNGS: clear to auscultation and percussion with normal breathing effort HEART: regular rate & rhythm  and no murmurs and no lower extremity edema ABDOMEN:abdomen soft, non-tender and normal bowel sounds Musculoskeletal:no cyanosis of digits and no clubbing  NEURO: alert & oriented x 3 with  fluent speech, no focal motor/sensory deficits  LABORATORY DATA:  I have reviewed the data as listed CBC Latest Ref Rng & Units 06/25/2016 03/25/2016 01/16/2016  WBC 3.9 - 10.3 10e3/uL 4.2 4.6 3.9  Hemoglobin 11.6 - 15.9 g/dL 14.0 13.8 10.0(L)  Hematocrit 34.8 - 46.6 % 41.0 40.4 32.0(L)  Platelets 145 - 400 10e3/uL 199 182 196     CMP Latest Ref Rng & Units 03/25/2016 01/05/2016 01/04/2016  Glucose 70 - 140 mg/dl 82 95 95  BUN 7.0 - 26.0 mg/dL 6.0(L) <5(L) <5(L)  Creatinine 0.6 - 1.1 mg/dL 0.9 0.80 0.83  Sodium 136 - 145 mEq/L 141 141 141  Potassium 3.5 - 5.1 mEq/L 4.2 3.0(L) 3.1(L)  Chloride 101 - 111 mmol/L - 110 112(H)  CO2 22 - 29 mEq/L 22 21(L) 21(L)  Calcium 8.4 - 10.4 mg/dL 9.6 8.1(L) 8.5(L)  Total Protein 6.4 - 8.3 g/dL 8.2 - -  Total Bilirubin 0.20 - 1.20 mg/dL 0.41 - -  Alkaline Phos 40 - 150 U/L 38(L) - -  AST 5 - 34 U/L 20 - -  ALT 0 - 55 U/L 22 - -   Results for JAQUANNA, DAMER (MRN [id number]) as of 09/24/2016 08:12  Ref. Range 01/16/2016 11:12 03/25/2016 13:22 06/25/2016 14:47  Iron Latest Ref Range: 41 - 142 ug/dL 21 (L) 58 87  UIBC Latest Ref Range: 120 - 384 ug/dL 440 (H) 435 (H) 388 (H)  TIBC Latest Ref Range: 236 - 444 ug/dL 461 (H) 493 (H) 475 (H)  %SAT Latest Ref Range: 21 - 57 % 5 (L) 12 (L) 18 (L)  Ferritin Latest Ref Range: 9 - 269 ng/ml 43 17 21    PATHOLOGY REPORT Diagnosis 02/05/2016 Fibroid, cervical mass - LEIOMYOMA. - THERE IS NO EVIDENCE OF MALIGNANCY.  RADIOGRAPHIC STUDIES: I have personally reviewed the radiological images as listed and agreed with the findings in the report. No results found.   ASSESSMENT & PLAN:  40 yo female, with past medical history of hypothyroidism, uterine fibroids, heavy menstrual periods, presented with severe anemia with a hemoglobin 3.6g/dl  1. Iron deficient anemia -Her initial lab revealed Hb 3.6, MCV 62.3, with absolute reticular count 34, ferritin 22, serum iron 9, elevated TIBC 521, and transferrin saturation 2%,  this is consistent with microcytic hypo-productive anemia, likely secondary to iron deficiency and menorrhagia -Her TSH, LDH was normal.  -She received IV Feraheme twice  -Her anemia has resolved now, she feels normal.  2. Thrombocytopenia -She had a mild thrombocytopenia when she was found to have severe anemia and heavy menstrual bleeding. -Resolved now. Her thrombocytopenia was likely secondary to her heavy bleeding.  3. Menorrhagia, uterine fibroid, left ovary and ovarian mass -Her menorrhagia has improved with Megace. -Her pelvic ultrasound on 12/31/2015 showed a left ovarian mass. -She is s/p fibroid removal and her menorrhagia has resolved. She'll continue follow-up with her gynecologist  4. History of Hypothyroidism -Her TSH was normal on 12/31/2015 -I encouraged her to have a primary care physician and a follow-up.   Plan -Her CBC results were discussed with her, iron studies still pending, she'll check from my chart -Repeat lab in 3 months, I'll see her back in 6 months with lab..  All questions were answered. The patient knows to call the clinic with any problems, questions or concerns.  No barriers to learning was detected.  I spent 15 minutes counseling the patient face to face. The total time spent in the appointment was 20 minutes and more than 50% was on counseling and review of test results     Truitt Merle, MD 09/24/16

## 2016-10-04 ENCOUNTER — Telehealth: Payer: Self-pay | Admitting: *Deleted

## 2016-10-04 NOTE — Telephone Encounter (Signed)
TC to patient to advise her of normal iron studies.  No answer but was able to leave VM message.

## 2016-11-23 ENCOUNTER — Other Ambulatory Visit: Payer: Self-pay | Admitting: Obstetrics and Gynecology

## 2016-11-24 ENCOUNTER — Other Ambulatory Visit: Payer: Self-pay | Admitting: General Practice

## 2016-11-24 ENCOUNTER — Encounter: Payer: Self-pay | Admitting: General Practice

## 2016-11-24 DIAGNOSIS — N938 Other specified abnormal uterine and vaginal bleeding: Secondary | ICD-10-CM

## 2016-11-24 MED ORDER — MEGESTROL ACETATE 40 MG PO TABS: ORAL_TABLET | ORAL | 2 refills | Status: DC

## 2017-08-05 ENCOUNTER — Ambulatory Visit: Payer: 59 | Admitting: Obstetrics and Gynecology

## 2018-06-15 ENCOUNTER — Ambulatory Visit (INDEPENDENT_AMBULATORY_CARE_PROVIDER_SITE_OTHER): Payer: 59 | Admitting: Obstetrics and Gynecology

## 2018-06-15 ENCOUNTER — Encounter: Payer: Self-pay | Admitting: Obstetrics and Gynecology

## 2018-06-15 VITALS — BP 135/92 | HR 104 | Ht 62.0 in | Wt 221.0 lb

## 2018-06-15 DIAGNOSIS — Z124 Encounter for screening for malignant neoplasm of cervix: Secondary | ICD-10-CM

## 2018-06-15 DIAGNOSIS — Z113 Encounter for screening for infections with a predominantly sexual mode of transmission: Secondary | ICD-10-CM | POA: Diagnosis not present

## 2018-06-15 DIAGNOSIS — Z01419 Encounter for gynecological examination (general) (routine) without abnormal findings: Secondary | ICD-10-CM

## 2018-06-15 DIAGNOSIS — Z1151 Encounter for screening for human papillomavirus (HPV): Secondary | ICD-10-CM

## 2018-06-15 DIAGNOSIS — D219 Benign neoplasm of connective and other soft tissue, unspecified: Secondary | ICD-10-CM

## 2018-06-15 DIAGNOSIS — N921 Excessive and frequent menstruation with irregular cycle: Secondary | ICD-10-CM

## 2018-06-15 DIAGNOSIS — Z1231 Encounter for screening mammogram for malignant neoplasm of breast: Secondary | ICD-10-CM

## 2018-06-15 MED ORDER — MEGESTROL ACETATE 40 MG PO TABS: 40.0000 mg | ORAL_TABLET | Freq: Every day | ORAL | 11 refills | Status: DC

## 2018-06-15 NOTE — Patient Instructions (Signed)

## 2018-06-15 NOTE — Addendum Note (Signed)
Addended by: Bethanne Ginger on: 06/15/2018 11:52 AM   Modules accepted: Orders

## 2018-06-15 NOTE — Progress Notes (Signed)
Alicia Carson is a 42 y.o. [phone] female here for a routine annual gynecologic exam. She has a history of uterine fibroids and heavy cycles. Cycles had been controlled with Megace. However she ran out of her Megace in December. Had regular cycles while on Megace. Had regular cycle Jan/Feb without Megace. No cycle March, April and May. June cycle lasted 10 days and was heavy.     She had an EMBX in 2/17 which was normal. She had a cervical fibroid removed in Feb as well.   Gynecologic History Patient's last menstrual period was 05/29/2018 (exact date). Contraception: condoms Last Pap: Uncertain.  Last mammogram: Uncertain.   Obstetric History OB History  Gravida Para Term Preterm AB Living  2 2 2  0 0 2  SAB TAB Ectopic Multiple Live Births  0 0 0 0 2    # Outcome Date GA Lbr Len/2nd Weight Sex Delivery Anes PTL Lv  2 Term 11/29/96 [redacted]w[redacted]d  7 lb (3.175 kg) F Vag-Spont        Birth Comments: no complications  1 Term [birth year]05[birth year]39 [redacted]w[redacted]d  7 lb (3.175 kg) M Vag-Spont   LIV     Birth Comments: no complications    Past Medical History:  Diagnosis Date  . Anemia   . Fibroid   . History of thyroid surgery 01/2004  . Thyroid disease    hypo  . Vaginal Pap smear, abnormal     Past Surgical History:  Procedure Laterality Date  . LEEP  2006  . NECK SURGERY Left 2005    Current Outpatient Medications on File Prior to Visit  Medication Sig Dispense Refill  . Multiple Vitamins-Minerals (WOMENS ONE DAILY PO) Take 1 tablet by mouth daily.     No current facility-administered medications on file prior to visit.     Allergies  Allergen Reactions  . Advil Pm [Ibuprofen-Diphenhydramine Cit] Hives    Social History   Socioeconomic History  . Marital status: Single    Spouse name: Not on file  . Number of children: Not on file  . Years of education: Not on file  . Highest education level: Not on file  Occupational History  . Not on file  Social Needs  . Financial resource strain:  Not on file  . Food insecurity:    Worry: Not on file    Inability: Not on file  . Transportation needs:    Medical: Not on file    Non-medical: Not on file  Tobacco Use  . Smoking status: Never Smoker  . Smokeless tobacco: Never Used  Substance and Sexual Activity  . Alcohol use: No  . Drug use: No  . Sexual activity: Yes    Birth control/protection: Condom  Lifestyle  . Physical activity:    Days per week: Not on file    Minutes per session: Not on file  . Stress: Not on file  Relationships  . Social connections:    Talks on phone: Not on file    Gets together: Not on file    Attends religious service: Not on file    Active member of club or organization: Not on file    Attends meetings of clubs or organizations: Not on file    Relationship status: Not on file  . Intimate partner violence:    Fear of current or ex partner: Not on file    Emotionally abused: Not on file    Physically abused: Not on file    Forced sexual activity: Not  on file  Other Topics Concern  . Not on file  Social History Narrative  . Not on file    Family History  Problem Relation Age of Onset  . Anemia Mother   . Hypertension Father   . Hypertension Sister   . Breast cancer Cousin        Mother's cousin had breast cancer and 5 of her daughters also have cancer  . Breast cancer Cousin   . Breast cancer Cousin   . Uterine cancer Neg Hx   . Colon cancer Neg Hx   . Ovarian cancer Neg Hx     The following portions of the patient's history were reviewed and updated as appropriate: allergies, current medications, past family history, past medical history, past social history, past surgical history and problem list.  Review of Systems Pertinent items noted in HPI and remainder of comprehensive ROS otherwise negative.   Objective:  BP (!) 135/92   Pulse (!) 104   Ht 5\' 2"  (1.575 m)   Wt 221 lb (100.2 kg)   LMP 05/29/2018 (Exact Date)   BMI 40.42 kg/m  CONSTITUTIONAL: Well-developed,  well-nourished female in no acute distress.  HENT:  Normocephalic, atraumatic, External right and left ear normal. Oropharynx is clear and moist EYES: Conjunctivae and EOM are normal. Pupils are equal, round, and reactive to light. No scleral icterus.  NECK: Normal range of motion, supple, no masses.  Normal thyroid.  SKIN: Skin is warm and dry. No rash noted. Not diaphoretic. No erythema. No pallor. [place]: Alert and oriented to person, place, and time. Normal reflexes, muscle tone coordination. No cranial nerve deficit noted. PSYCHIATRIC: Normal mood and affect. Normal behavior. Normal judgment and thought content. CARDIOVASCULAR: Normal heart rate noted, regular rhythm RESPIRATORY: Clear to auscultation bilaterally. Effort and breath sounds normal, no problems with respiration noted. BREASTS: Symmetric in size. No masses, skin changes, nipple drainage, or lymphadenopathy. ABDOMEN: Soft, normal bowel sounds, no distention noted.  No tenderness, rebound or guarding.  PELVIC: Normal appearing external genitalia; normal appearing vaginal mucosa and cervix.  No abnormal discharge noted.  Pap smear obtained.  Normal uterine size, no other palpable masses, no uterine or adnexal tenderness. MUSCULOSKELETAL: Normal range of motion. No tenderness.  No cyanosis, clubbing, or edema.  2+ distal pulses.   Assessment:  Annual gynecologic examination with pap smear Heavy cycle H/O Uterine fibroids Plan:  Will follow up results of pap smear and manage accordingly. Mammogram scheduled Will restart Megace and check GYN U/S.  F/U in 4 weeks to discuss U/S results and Tx options Routine preventative health maintenance measures emphasized. Please refer to After Visit Summary for other counseling recommendations.    Chancy Milroy, MD, [place] Attending [place] for [facility], [place]

## 2018-06-20 LAB — CYTOLOGY - PAP
Chlamydia: NEGATIVE
Diagnosis: NEGATIVE
HPV (WINDOPATH): NOT DETECTED
Neisseria Gonorrhea: NEGATIVE

## 2018-07-11 ENCOUNTER — Ambulatory Visit
Admission: RE | Admit: 2018-07-11 | Discharge: 2018-07-11 | Disposition: A | Discharge status: Home / Self Care | Payer: 59 | Source: Ambulatory Visit | Attending: Obstetrics and Gynecology | Admitting: Obstetrics and Gynecology

## 2018-07-11 DIAGNOSIS — Z1231 Encounter for screening mammogram for malignant neoplasm of breast: Secondary | ICD-10-CM

## 2018-07-12 ENCOUNTER — Other Ambulatory Visit: Payer: Self-pay | Admitting: Obstetrics and Gynecology

## 2018-07-12 DIAGNOSIS — R928 Other abnormal and inconclusive findings on diagnostic imaging of breast: Secondary | ICD-10-CM

## 2018-07-14 ENCOUNTER — Ambulatory Visit (INDEPENDENT_AMBULATORY_CARE_PROVIDER_SITE_OTHER): Payer: 59 | Admitting: Obstetrics and Gynecology

## 2018-07-14 ENCOUNTER — Encounter: Payer: Self-pay | Admitting: Obstetrics and Gynecology

## 2018-07-14 ENCOUNTER — Ambulatory Visit (HOSPITAL_COMMUNITY): Payer: 59

## 2018-07-14 VITALS — BP 135/84 | HR 101 | Ht 62.0 in | Wt 224.4 lb

## 2018-07-14 DIAGNOSIS — N921 Excessive and frequent menstruation with irregular cycle: Secondary | ICD-10-CM | POA: Diagnosis not present

## 2018-07-14 DIAGNOSIS — N939 Abnormal uterine and vaginal bleeding, unspecified: Secondary | ICD-10-CM | POA: Diagnosis not present

## 2018-07-14 DIAGNOSIS — D219 Benign neoplasm of connective and other soft tissue, unspecified: Secondary | ICD-10-CM

## 2018-07-14 NOTE — Progress Notes (Signed)
Pt here today for follow up for heavy cycles. However has not had U/S Cycles better with Megace.  PE AF VSS Lungs clear Heart RRR Abd soft + BS  A/P Uterine fibroids        Heavy cycles/AUB  U/S has been scheduled. To continue with Megace. Breast U/S schedule d/t breast asymmtery noted on screening mammogram F/U per test results

## 2018-07-17 ENCOUNTER — Other Ambulatory Visit: Payer: Self-pay | Admitting: Obstetrics and Gynecology

## 2018-07-17 ENCOUNTER — Ambulatory Visit (HOSPITAL_COMMUNITY)
Admission: RE | Admit: 2018-07-17 | Discharge: 2018-07-17 | Disposition: A | Discharge status: Home / Self Care | Payer: 59 | Source: Ambulatory Visit | Attending: Obstetrics and Gynecology | Admitting: Obstetrics and Gynecology

## 2018-07-17 DIAGNOSIS — D259 Leiomyoma of uterus, unspecified: Secondary | ICD-10-CM | POA: Diagnosis not present

## 2018-07-17 DIAGNOSIS — N939 Abnormal uterine and vaginal bleeding, unspecified: Secondary | ICD-10-CM | POA: Diagnosis present

## 2018-07-19 ENCOUNTER — Ambulatory Visit: Payer: 59

## 2018-07-19 ENCOUNTER — Ambulatory Visit
Admission: RE | Admit: 2018-07-19 | Discharge: 2018-07-19 | Disposition: A | Discharge status: Home / Self Care | Payer: 59 | Source: Ambulatory Visit | Attending: Obstetrics and Gynecology | Admitting: Obstetrics and Gynecology

## 2018-07-19 DIAGNOSIS — R928 Other abnormal and inconclusive findings on diagnostic imaging of breast: Secondary | ICD-10-CM

## 2018-07-25 ENCOUNTER — Telehealth: Payer: Self-pay | Admitting: General Practice

## 2018-07-25 NOTE — Telephone Encounter (Signed)
-----   Message from Chancy Milroy, MD sent at 07/20/2018  8:17 AM EDT ----- Please let pt know that she has a uterine fibroid. Continue with the Megace. F/U appt with me in 6-8 weeks or sooner if pt desires. Thanks Legrand Como

## 2018-07-25 NOTE — Telephone Encounter (Signed)
Called patient & informed her of results and recommendations. Discussed someone from our front office will contact her with a follow up appt. Patient verbalized understanding to all & had no questions.

## 2018-08-24 ENCOUNTER — Ambulatory Visit: Payer: 59 | Admitting: Obstetrics and Gynecology

## 2018-09-20 ENCOUNTER — Encounter: Payer: Self-pay | Admitting: *Deleted

## 2019-09-25 ENCOUNTER — Other Ambulatory Visit: Payer: Self-pay | Admitting: *Deleted

## 2019-09-25 DIAGNOSIS — N921 Excessive and frequent menstruation with irregular cycle: Secondary | ICD-10-CM

## 2019-09-25 MED ORDER — MEGESTROL ACETATE 40 MG PO TABS: 40.0000 mg | ORAL_TABLET | Freq: Every day | ORAL | 0 refills | Status: AC

## 2019-09-25 NOTE — Telephone Encounter (Signed)
Received refill request for Megestrol . Will route to provider.  Linda,RN

## 2020-06-30 IMAGING — MG DIGITAL SCREENING BILATERAL MAMMOGRAM WITH CAD
3 series · 3 of 3 positions shown · non-contrast
Comparison: None

CLINICAL DATA: Screening.

EXAM:
DIGITAL SCREENING BILATERAL MAMMOGRAM WITH CAD

[R MLO]
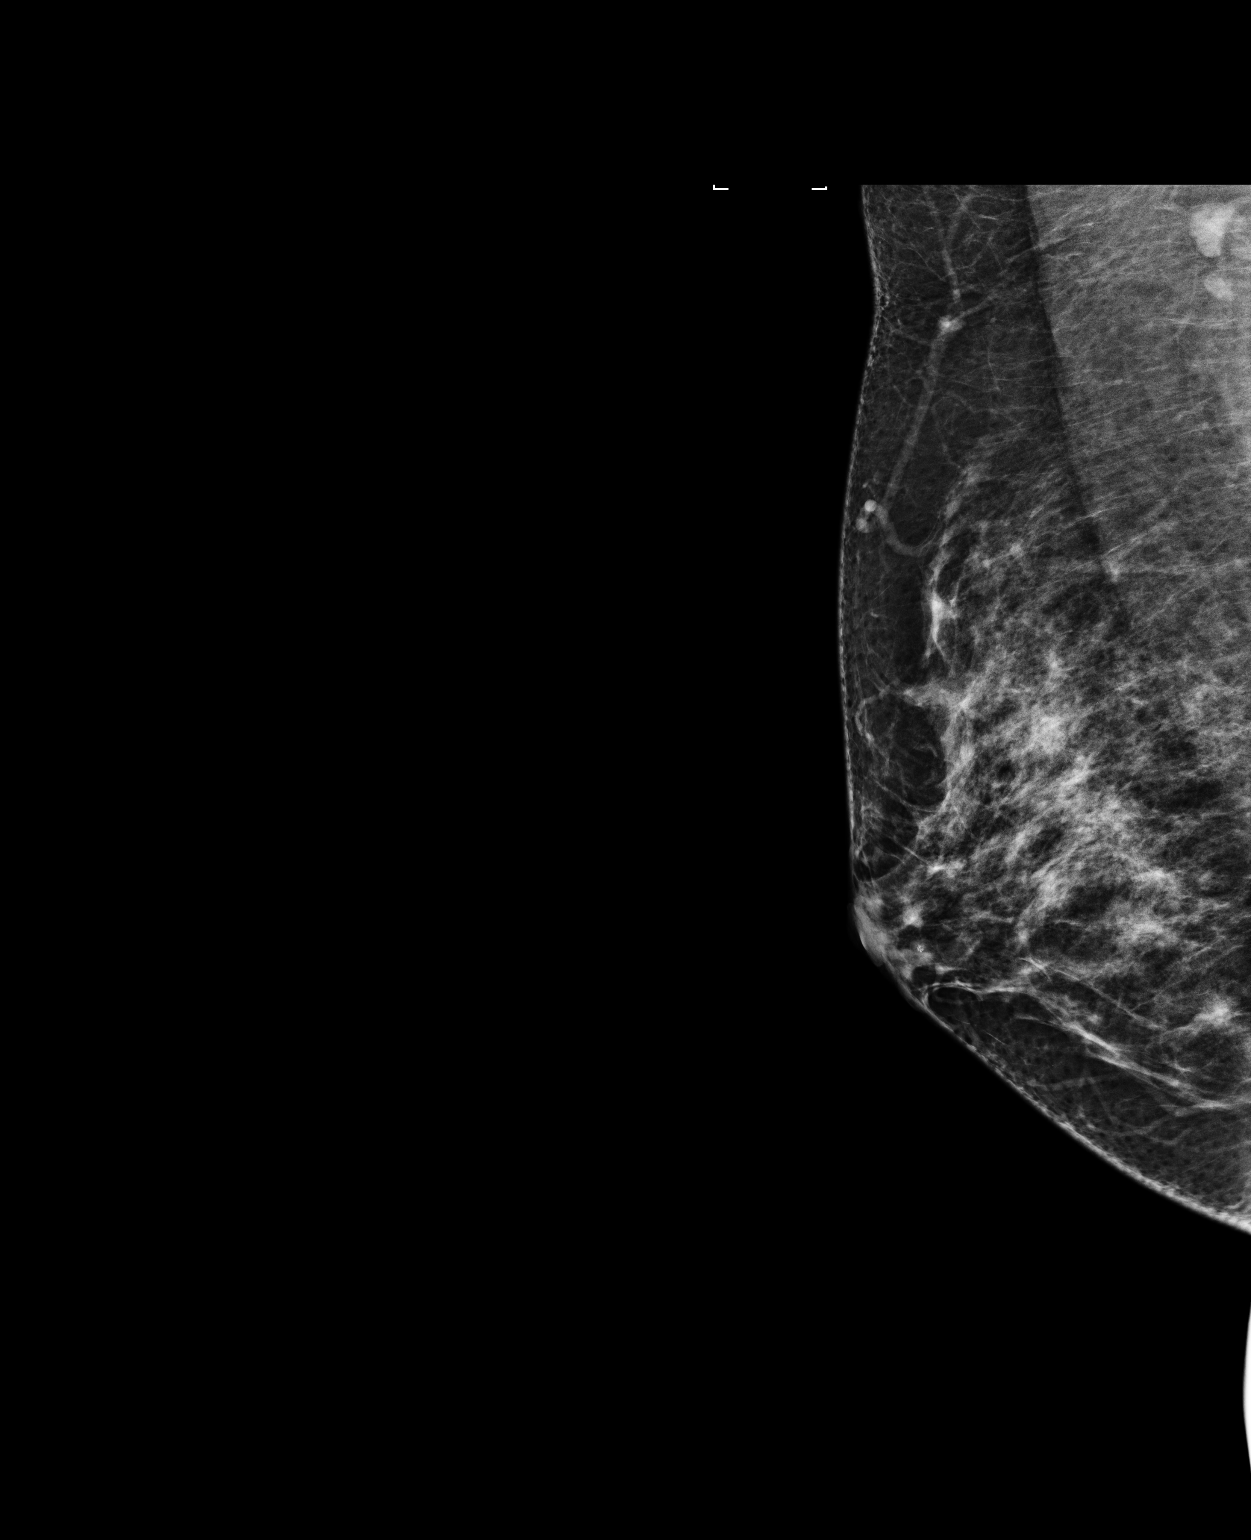

[L CC]
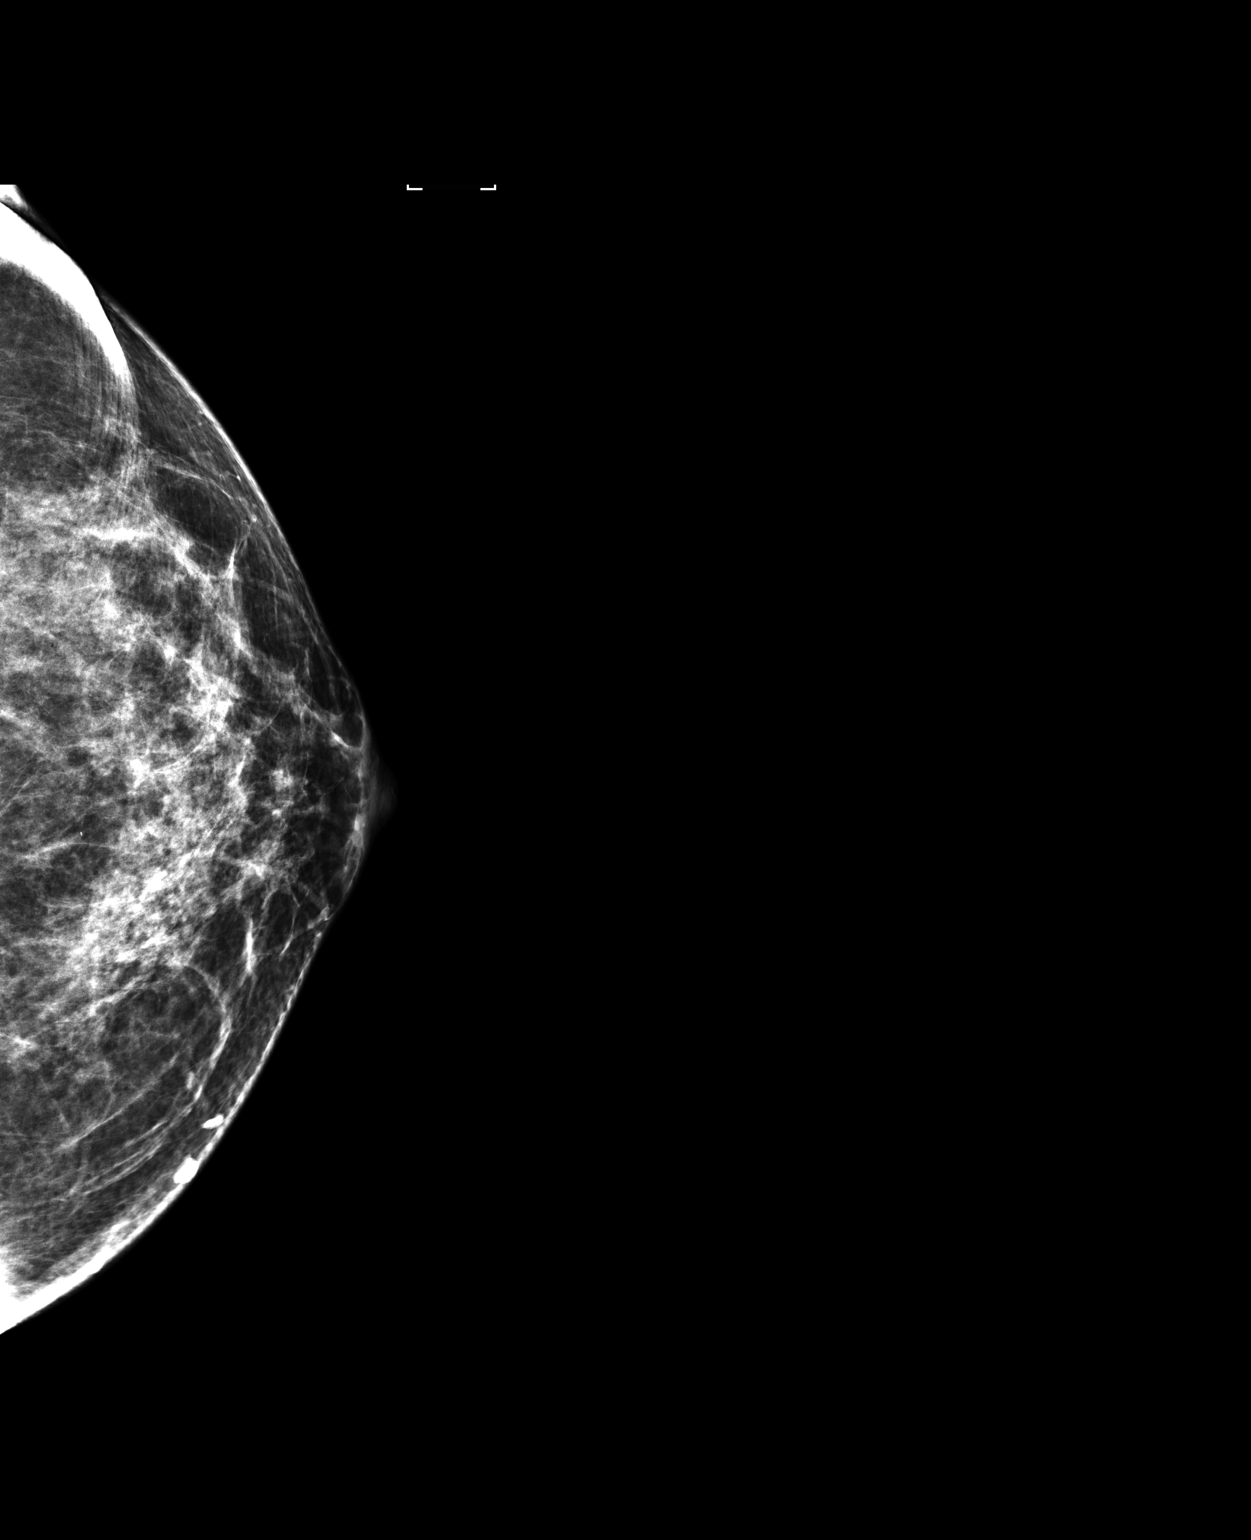

[L MLO]
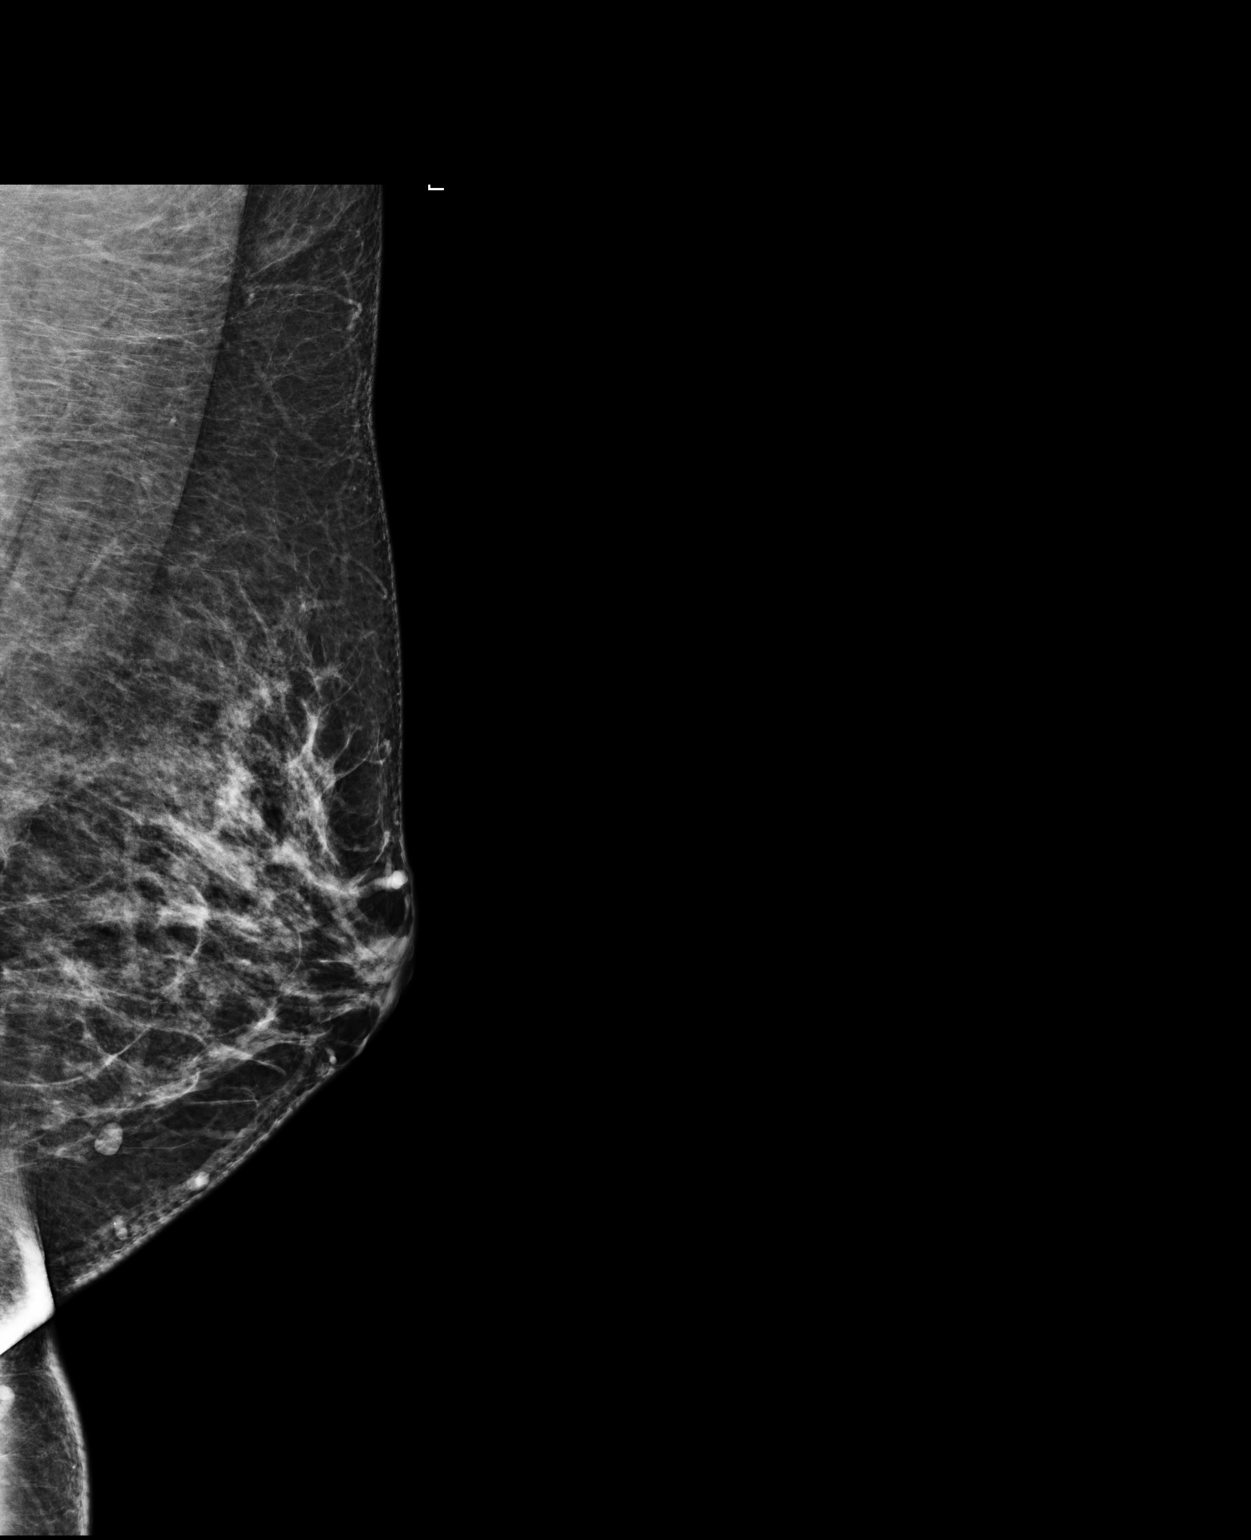

[3 of 3 positions shown; findings below may reference images not displayed]

ACR Breast Density Category c: The breast tissue is heterogeneously
dense, which may obscure small masses.
FINDINGS: In the right breast, a possible asymmetry warrants further
evaluation. In the left breast, no findings suspicious for
malignancy. Images were processed with CAD.
IMPRESSION: Further evaluation is suggested for possible asymmetry in the right
breast.

RECOMMENDATION:
Diagnostic mammogram and possibly ultrasound of the right breast.
(Code:WL-[id number])

The patient will be contacted regarding the findings, and additional
imaging will be scheduled.

BI-RADS CATEGORY  0: Incomplete. Need additional imaging evaluation
and/or prior mammograms for comparison.

## 2020-07-06 IMAGING — US US PELVIS COMPLETE
1 series · 15 of 25 positions shown · non-contrast
Comparison: 12/31/2015

CLINICAL DATA: Abnormal uterine bleeding. Menorrhagia. Fibroids for
2 years. Previous LEEP. Gravida 2 para 2. LMP 07/16/2018.

EXAM:
TRANSABDOMINAL ULTRASOUND OF PELVIS
TECHNIQUE: Transabdominal ultrasound examination of the pelvis was performed
including evaluation of the uterus, ovaries, adnexal regions, and
pelvic cul-de-sac.

[Series 1: us pelvis complete · 15 of 37 slices shown]
[im 1/37]
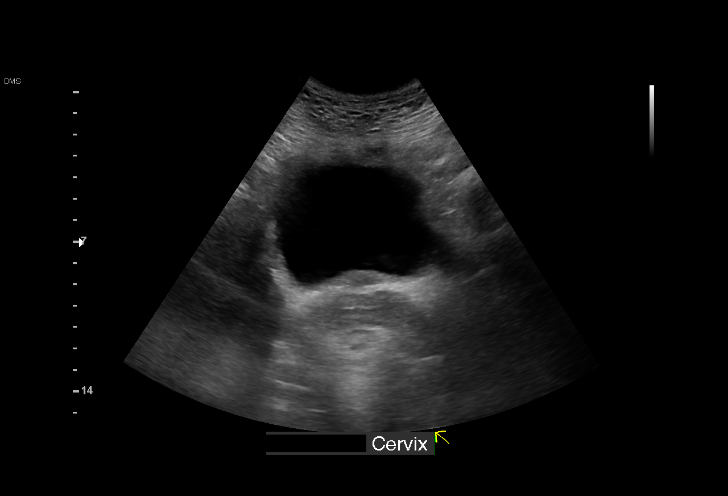
[im 4/37]
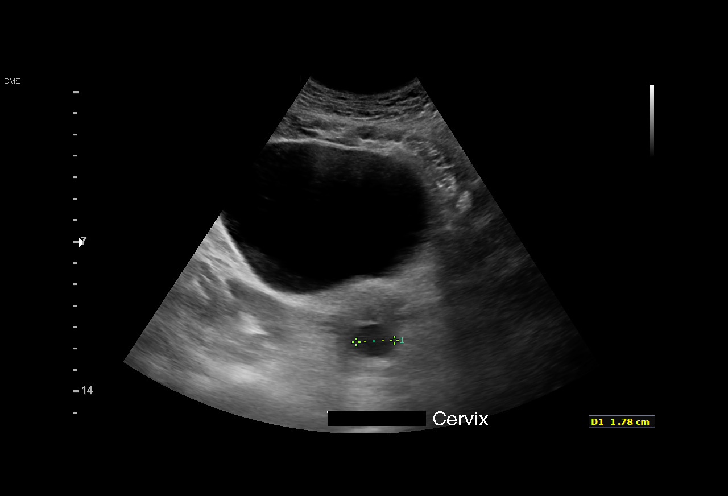
[im 7/37]
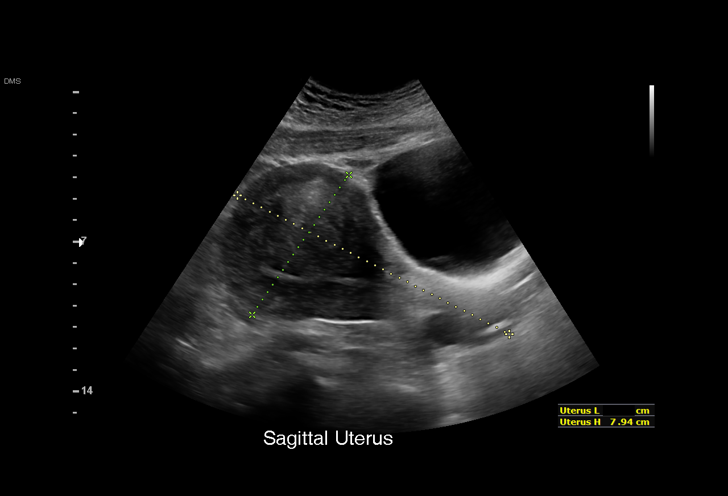
[im 8/37]
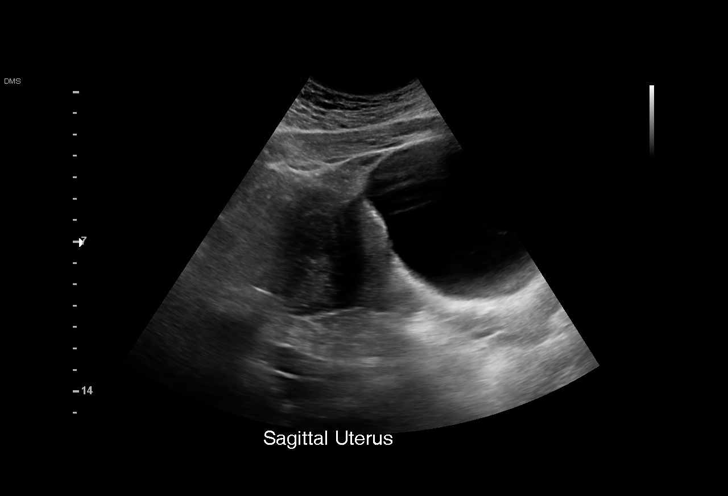
[im 11/37]
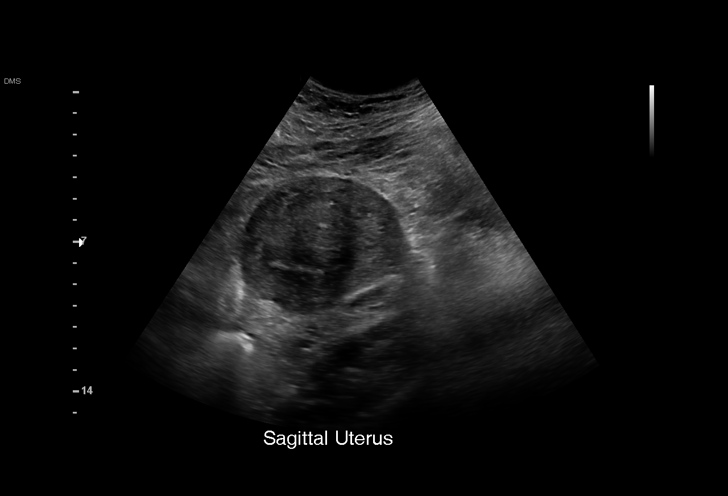
[im 14/37]
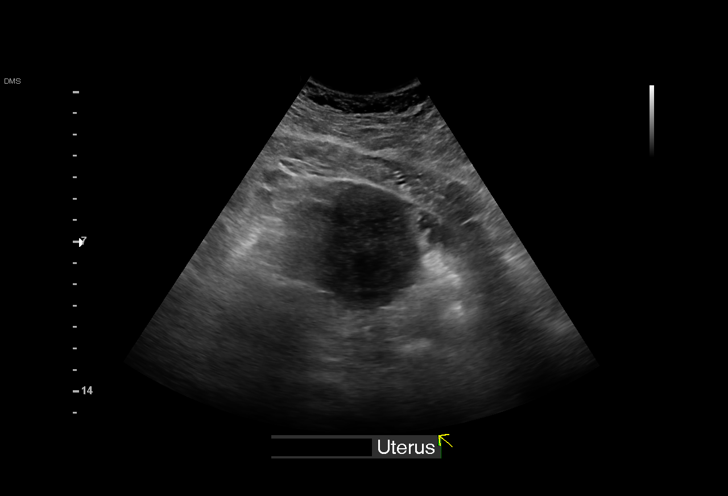
[im 16/37]
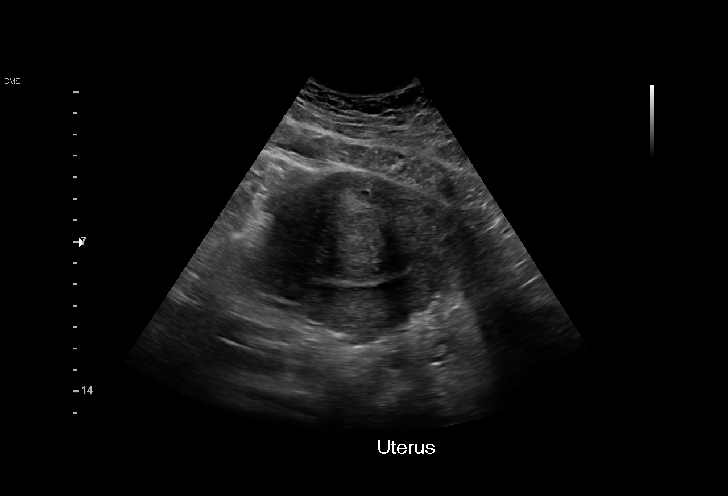
[im 19/37]
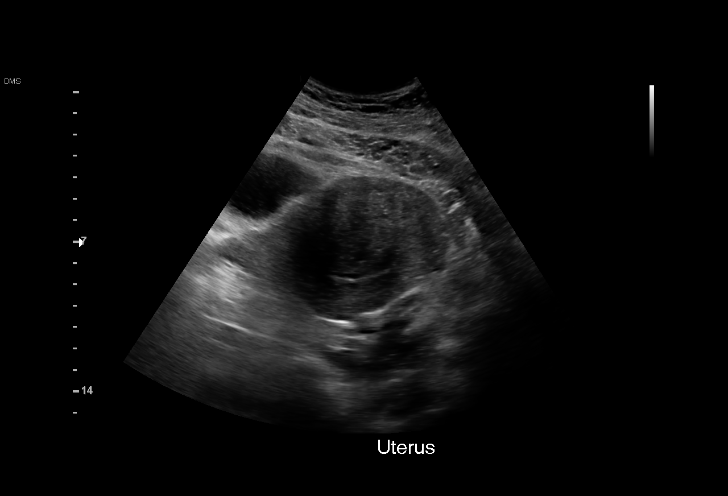
[im 22/37]
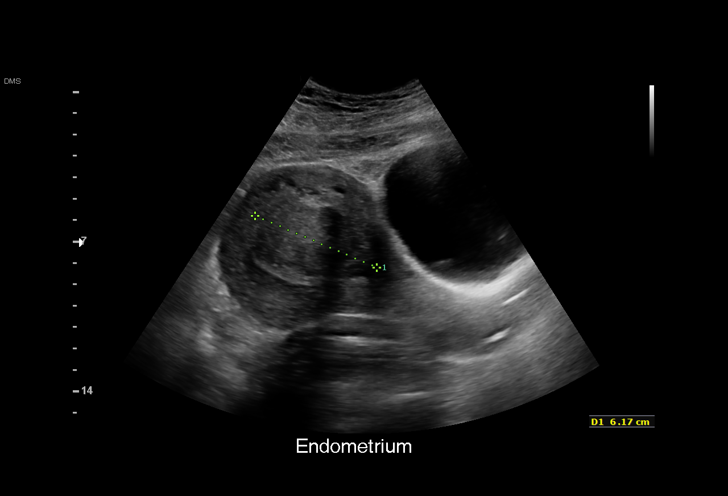
[im 23/37]
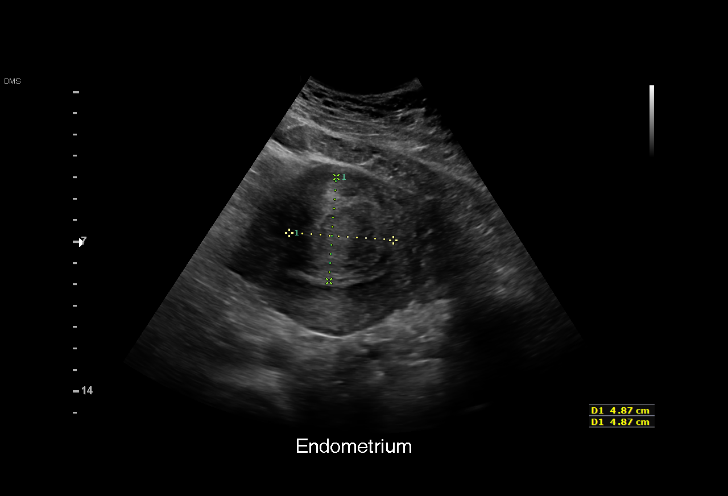
[im 26/37]
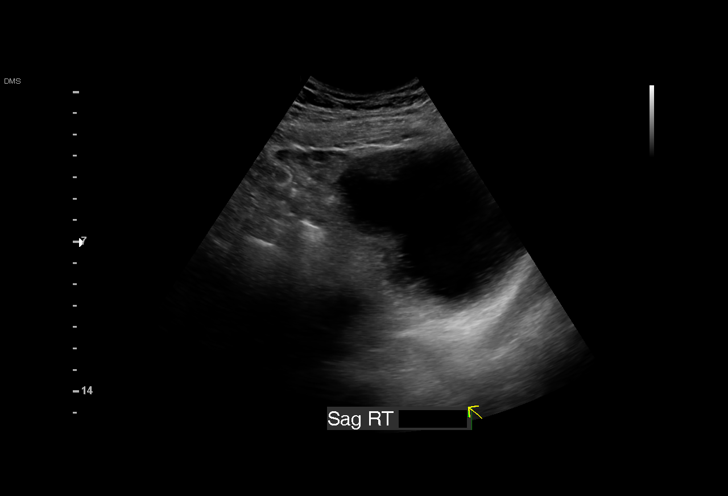
[im 29/37]
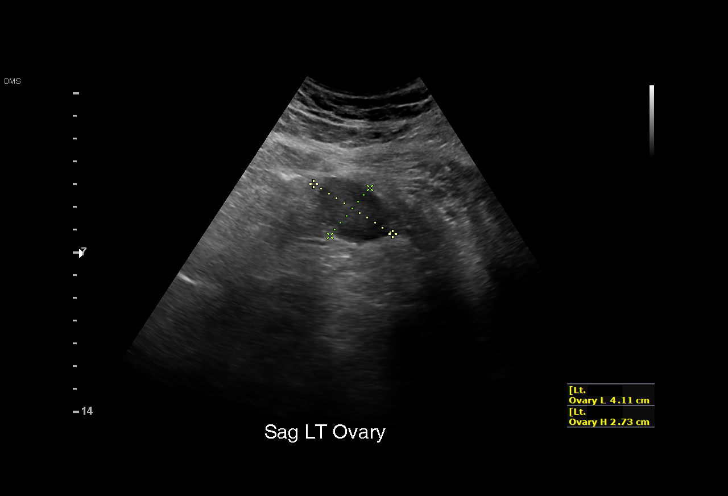
[im 31/37]
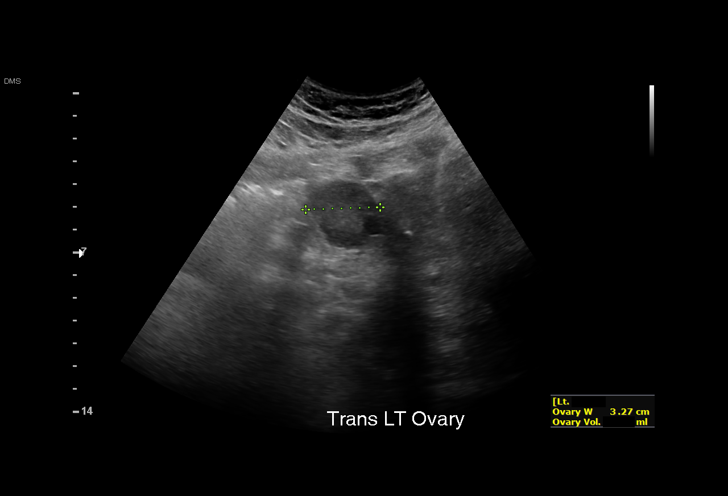
[im 34/37]
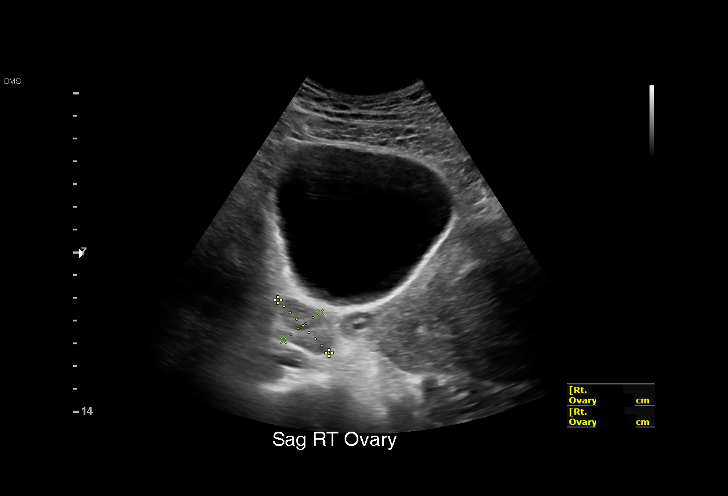
[im 37/37]
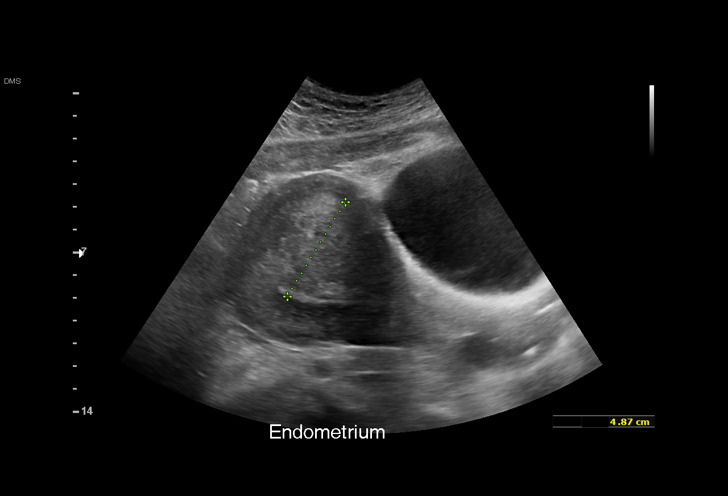

[15 of 25 positions shown; findings below may reference images not displayed]

FINDINGS: Uterus

Measurements: At least 14.3 x 7.9 x 9.8 centimeters.. Central
uterine fibroid is 6.2 x 4.9 x 4.9 centimeters and displaces the
endometrial canal. Small nabothian cyst is present. The cervix is
not as well evaluated on the current study compared with previous
exam from the

Endometrium

Thickness: Estimated to be 3.8 millimeters. Large central fibroid
displaces the endometrium posteriorly.

Right ovary

Measurements: 3.2 x 2.0 x 3.9 centimeter. Normal appearance/no
adnexal mass.

Left ovary

Measurements: 4.1 x 2.7 x 3.3 centimeters. Normal appearance/no
adnexal mass.

Other findings:  No abnormal free fluid.
IMPRESSION: 1. Central uterine fibroid, likely submucosal in location and
displacing the endometrial canal posteriorly.
2. Heterogeneous cervical mass not well evaluated on the current
study.
3. Normal appearance of both ovaries.

## 2020-07-08 IMAGING — MG DIGITAL DIAGNOSTIC UNILATERAL RIGHT MAMMOGRAM WITH TOMO AND CAD
8 series · 9 of 24 positions shown · non-contrast
Comparison: Baseline screening mammogram dated 07/11/2018.

CLINICAL DATA: Patient returns today to evaluate a possible RIGHT
breast asymmetry identified on baseline screening mammogram.

EXAM:
DIGITAL DIAGNOSTIC UNILATERAL RIGHT MAMMOGRAM WITH CAD AND TOMO

[R CC synth-2D (1 of 2)]
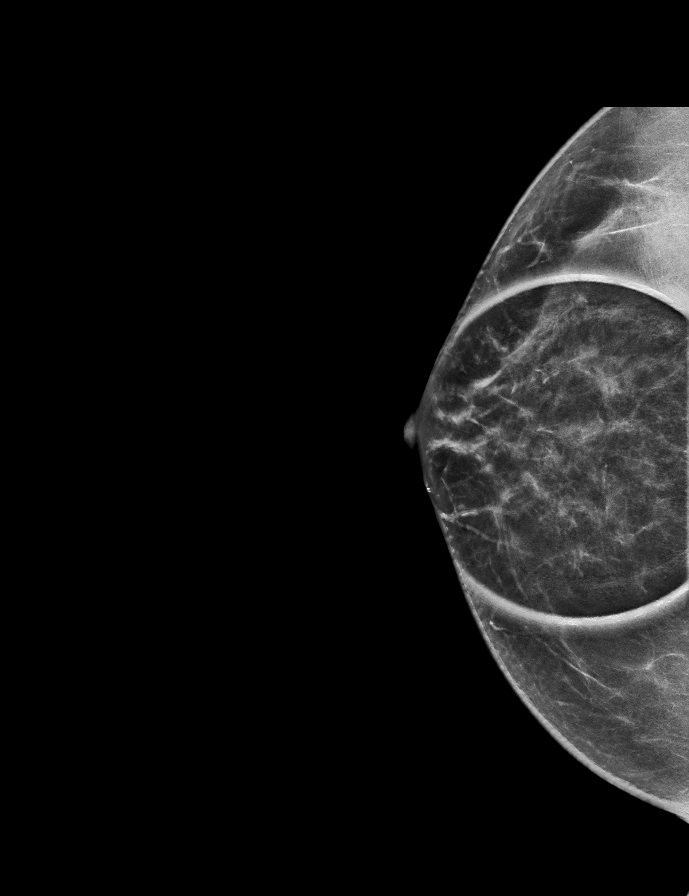

[R ML synth-2D]
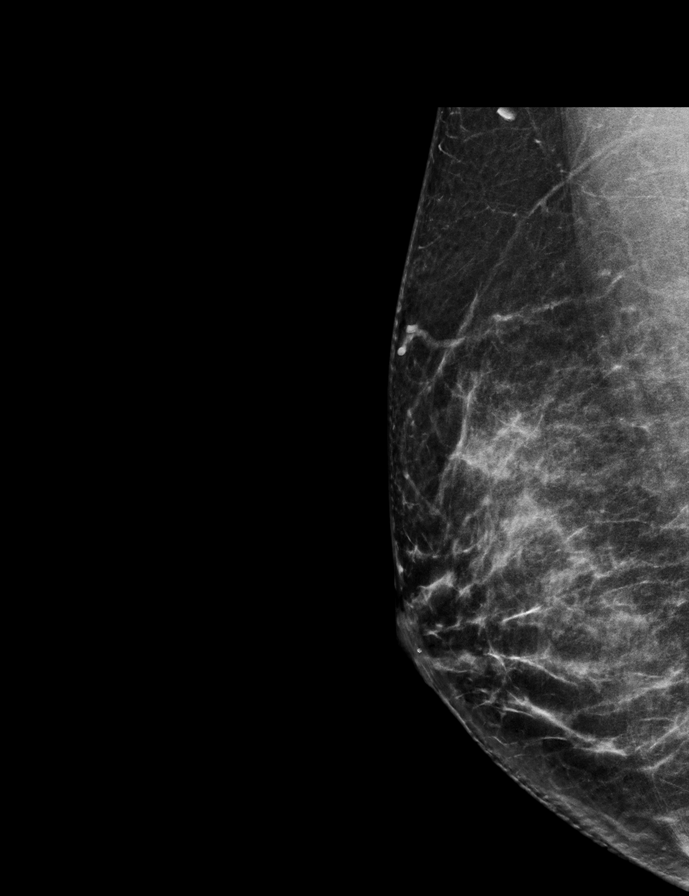

[R MLO synth-2D]
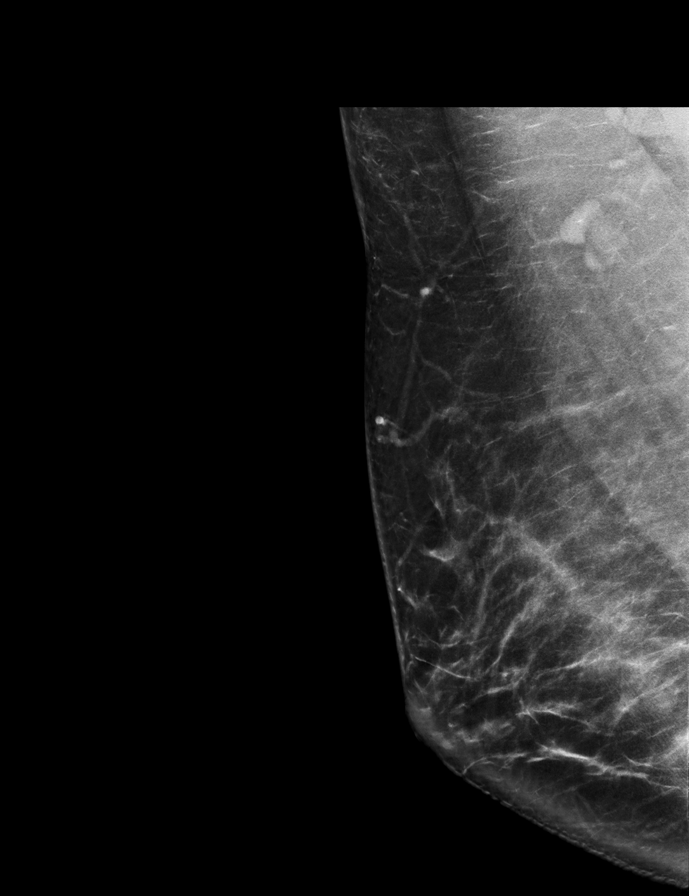

[R CC synth-2D (2 of 2)]
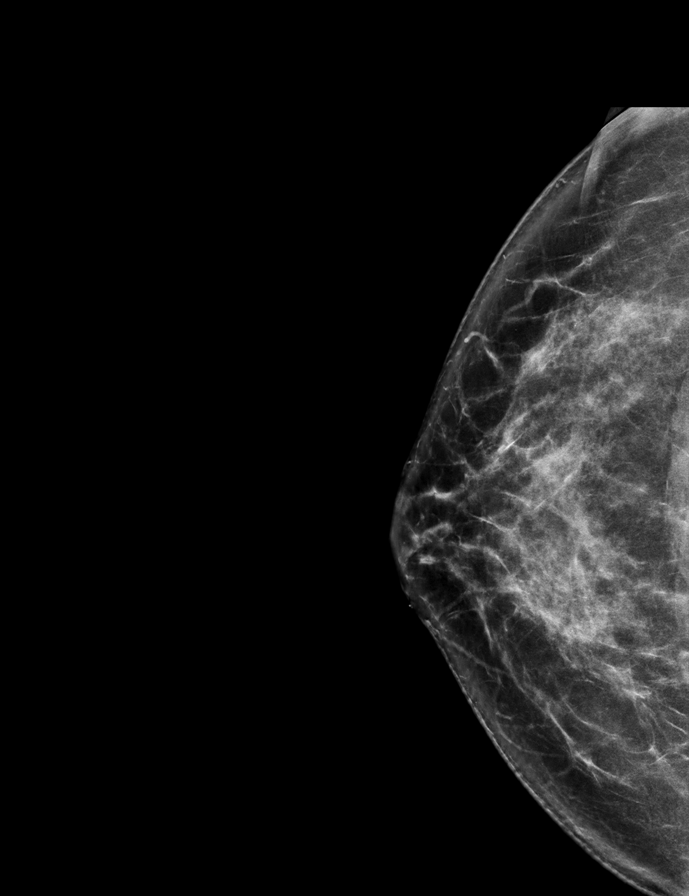

[R ML tomo · 2 of 71 frames shown]
[frame 23/71]
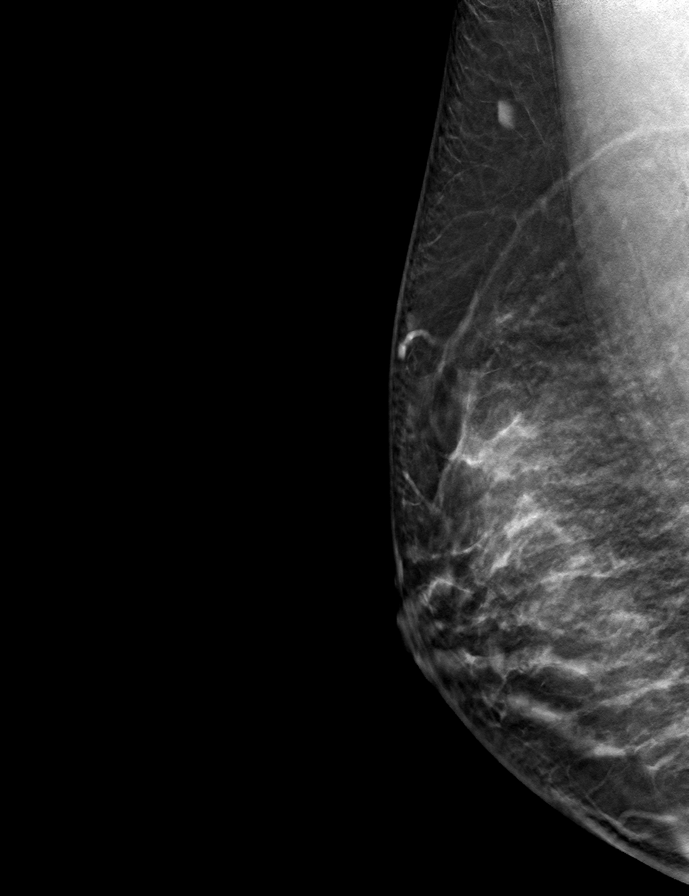
[frame 36/71]
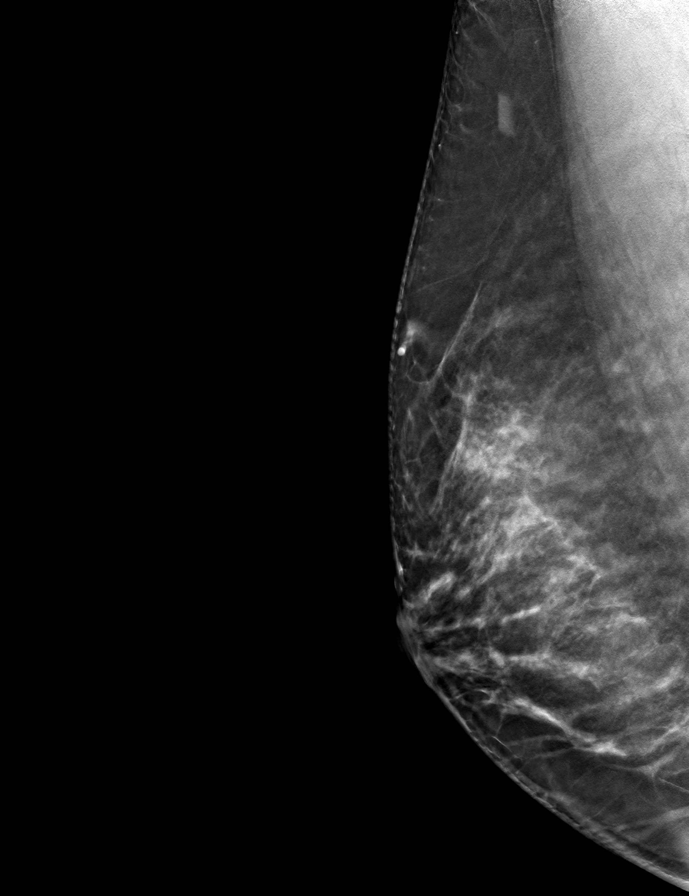

[R CC tomo (1 of 2) · tomo slice 39/78.0]
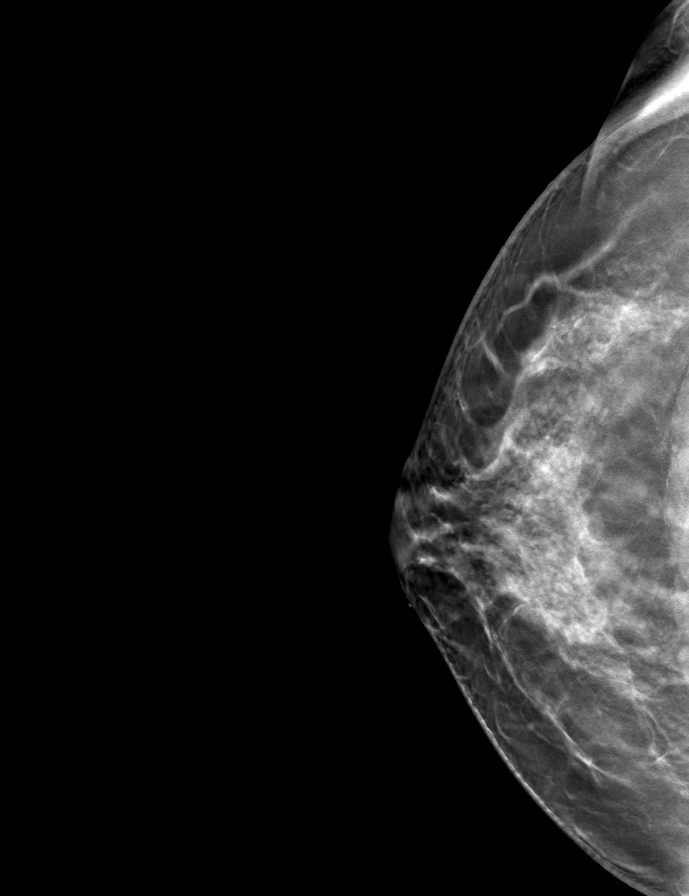

[R CC tomo (2 of 2) · tomo slice 35/69.0]
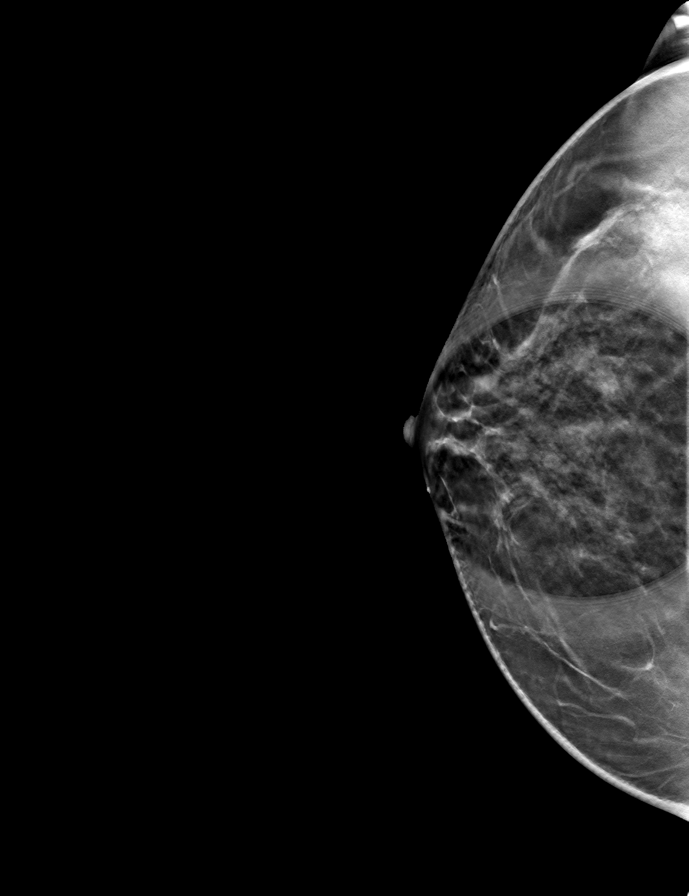

[R MLO tomo · tomo slice 47/93.0]
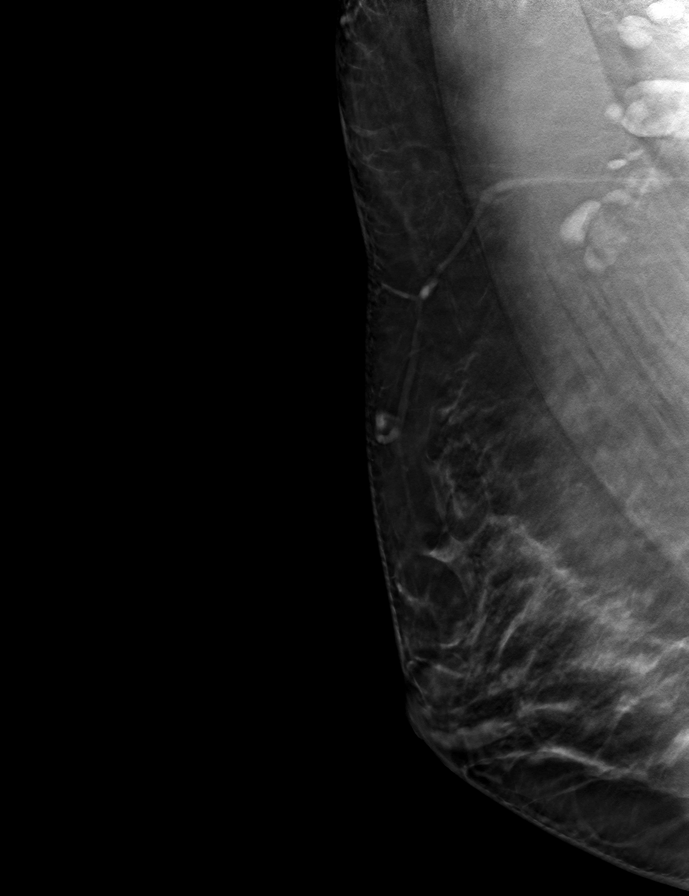

[9 of 24 positions shown; findings below may reference images not displayed]

ACR Breast Density Category c: The breast tissue is heterogeneously
dense, which may obscure small masses.
FINDINGS: On today's additional diagnostic views with 3D tomosynthesis, there
is no persistent asymmetry within the RIGHT breast indicating
superimposition of normal fibroglandular tissues. There are no
dominant masses, suspicious calcifications or secondary signs of
malignancy identified on today's exam.

Mammographic images were processed with CAD.
IMPRESSION: No evidence of malignancy. Patient may return to routine annual
bilateral screening mammogram schedule.

RECOMMENDATION:
Screening mammogram in one year.(Code:VE-V-LN0)

I have discussed the findings and recommendations with the patient.
Results were also provided in writing at the conclusion of the
visit. If applicable, a reminder letter will be sent to the patient
regarding the next appointment.

BI-RADS CATEGORY  1: Negative.
